# Patient Record
Sex: Female | Born: 1960 | Race: White | Hispanic: No | Smoking: Never smoker
Health system: Southern US, Community
[De-identification: ages and names within clinical notes are randomized; demographics above are authoritative.]

## PROBLEM LIST (undated history)

## (undated) DIAGNOSIS — E785 Hyperlipidemia, unspecified: Secondary | ICD-10-CM

## (undated) DIAGNOSIS — F32A Depression, unspecified: Secondary | ICD-10-CM

## (undated) DIAGNOSIS — I10 Essential (primary) hypertension: Secondary | ICD-10-CM

## (undated) DIAGNOSIS — F329 Major depressive disorder, single episode, unspecified: Secondary | ICD-10-CM

## (undated) DIAGNOSIS — E119 Type 2 diabetes mellitus without complications: Secondary | ICD-10-CM

## (undated) HISTORY — PX: ESOPHAGOGASTRODUODENOSCOPY: SHX1529

## (undated) HISTORY — PX: COLONOSCOPY: SHX174

## (undated) HISTORY — PX: ABDOMINAL HYSTERECTOMY: SHX81

---

## 2004-07-12 ENCOUNTER — Ambulatory Visit: Payer: Self-pay | Admitting: Family Medicine

## 2004-07-14 ENCOUNTER — Ambulatory Visit: Payer: Self-pay | Admitting: Family Medicine

## 2004-08-18 ENCOUNTER — Ambulatory Visit: Payer: Self-pay | Admitting: Family Medicine

## 2004-09-08 ENCOUNTER — Ambulatory Visit: Payer: Self-pay | Admitting: Family Medicine

## 2004-09-11 ENCOUNTER — Ambulatory Visit: Payer: Self-pay | Admitting: Family Medicine

## 2004-10-11 ENCOUNTER — Ambulatory Visit: Payer: Self-pay | Admitting: Family Medicine

## 2006-07-15 ENCOUNTER — Emergency Department: Payer: Self-pay | Admitting: Unknown Physician Specialty

## 2006-08-25 ENCOUNTER — Ambulatory Visit: Payer: Self-pay | Admitting: Gastroenterology

## 2006-09-06 ENCOUNTER — Ambulatory Visit: Payer: Self-pay | Admitting: Gastroenterology

## 2006-10-18 ENCOUNTER — Ambulatory Visit: Payer: Self-pay | Admitting: Family Medicine

## 2009-09-09 ENCOUNTER — Ambulatory Visit: Payer: Self-pay | Admitting: Family Medicine

## 2010-10-06 ENCOUNTER — Ambulatory Visit: Payer: Self-pay | Admitting: Family Medicine

## 2013-02-27 ENCOUNTER — Ambulatory Visit: Payer: Self-pay | Admitting: Family Medicine

## 2014-10-27 DIAGNOSIS — R519 Headache, unspecified: Secondary | ICD-10-CM | POA: Insufficient documentation

## 2014-10-27 DIAGNOSIS — H8110 Benign paroxysmal vertigo, unspecified ear: Secondary | ICD-10-CM | POA: Insufficient documentation

## 2014-11-12 DIAGNOSIS — F32A Depression, unspecified: Secondary | ICD-10-CM | POA: Insufficient documentation

## 2014-11-13 ENCOUNTER — Other Ambulatory Visit: Payer: Self-pay | Admitting: Family Medicine

## 2014-11-13 DIAGNOSIS — Z1231 Encounter for screening mammogram for malignant neoplasm of breast: Secondary | ICD-10-CM

## 2014-11-20 ENCOUNTER — Ambulatory Visit
Admission: RE | Admit: 2014-11-20 | Discharge: 2014-11-20 | Disposition: A | Discharge status: Home / Self Care | Payer: Medicaid Other | Source: Ambulatory Visit | Attending: Family Medicine | Admitting: Family Medicine

## 2014-11-20 DIAGNOSIS — Z1231 Encounter for screening mammogram for malignant neoplasm of breast: Secondary | ICD-10-CM | POA: Diagnosis present

## 2014-11-21 ENCOUNTER — Other Ambulatory Visit: Payer: Self-pay | Admitting: Family Medicine

## 2014-11-21 DIAGNOSIS — R928 Other abnormal and inconclusive findings on diagnostic imaging of breast: Secondary | ICD-10-CM

## 2014-11-26 ENCOUNTER — Ambulatory Visit
Admission: RE | Admit: 2014-11-26 | Discharge: 2014-11-26 | Disposition: A | Discharge status: Home / Self Care | Payer: Medicaid Other | Source: Ambulatory Visit | Attending: Family Medicine | Admitting: Family Medicine

## 2014-11-26 DIAGNOSIS — R928 Other abnormal and inconclusive findings on diagnostic imaging of breast: Secondary | ICD-10-CM | POA: Diagnosis present

## 2015-11-18 ENCOUNTER — Other Ambulatory Visit: Payer: Self-pay | Admitting: Orthopedic Surgery

## 2015-11-18 DIAGNOSIS — M4722 Other spondylosis with radiculopathy, cervical region: Secondary | ICD-10-CM

## 2015-11-18 DIAGNOSIS — R29898 Other symptoms and signs involving the musculoskeletal system: Secondary | ICD-10-CM

## 2015-12-10 ENCOUNTER — Ambulatory Visit
Admission: RE | Admit: 2015-12-10 | Discharge: 2015-12-10 | Disposition: A | Discharge status: Home / Self Care | Payer: Medicaid Other | Source: Ambulatory Visit | Attending: Orthopedic Surgery | Admitting: Orthopedic Surgery

## 2015-12-10 DIAGNOSIS — R29898 Other symptoms and signs involving the musculoskeletal system: Secondary | ICD-10-CM | POA: Diagnosis present

## 2015-12-10 DIAGNOSIS — M4722 Other spondylosis with radiculopathy, cervical region: Secondary | ICD-10-CM | POA: Diagnosis not present

## 2015-12-10 DIAGNOSIS — M4802 Spinal stenosis, cervical region: Secondary | ICD-10-CM | POA: Diagnosis not present

## 2016-12-21 DIAGNOSIS — E221 Hyperprolactinemia: Secondary | ICD-10-CM | POA: Insufficient documentation

## 2018-01-15 ENCOUNTER — Encounter: Payer: Self-pay | Admitting: *Deleted

## 2018-01-16 ENCOUNTER — Encounter: Payer: Self-pay | Admitting: *Deleted

## 2018-01-16 ENCOUNTER — Ambulatory Visit
Admission: RE | Admit: 2018-01-16 | Discharge: 2018-01-16 | Disposition: A | Discharge status: Home / Self Care | Payer: Medicare Other | Source: Ambulatory Visit | Attending: Gastroenterology | Admitting: Gastroenterology

## 2018-01-16 ENCOUNTER — Encounter
Admission: RE | Disposition: A | Discharge status: Home / Self Care | Payer: Self-pay | Source: Ambulatory Visit | Attending: Gastroenterology

## 2018-01-16 ENCOUNTER — Ambulatory Visit: Payer: Medicare Other | Admitting: Certified Registered"

## 2018-01-16 DIAGNOSIS — I10 Essential (primary) hypertension: Secondary | ICD-10-CM | POA: Insufficient documentation

## 2018-01-16 DIAGNOSIS — Z1211 Encounter for screening for malignant neoplasm of colon: Secondary | ICD-10-CM | POA: Insufficient documentation

## 2018-01-16 DIAGNOSIS — E119 Type 2 diabetes mellitus without complications: Secondary | ICD-10-CM | POA: Diagnosis not present

## 2018-01-16 DIAGNOSIS — E785 Hyperlipidemia, unspecified: Secondary | ICD-10-CM | POA: Diagnosis not present

## 2018-01-16 DIAGNOSIS — D120 Benign neoplasm of cecum: Secondary | ICD-10-CM | POA: Insufficient documentation

## 2018-01-16 DIAGNOSIS — Z881 Allergy status to other antibiotic agents status: Secondary | ICD-10-CM | POA: Diagnosis not present

## 2018-01-16 DIAGNOSIS — Z882 Allergy status to sulfonamides status: Secondary | ICD-10-CM | POA: Insufficient documentation

## 2018-01-16 DIAGNOSIS — F329 Major depressive disorder, single episode, unspecified: Secondary | ICD-10-CM | POA: Diagnosis not present

## 2018-01-16 DIAGNOSIS — Z79899 Other long term (current) drug therapy: Secondary | ICD-10-CM | POA: Insufficient documentation

## 2018-01-16 HISTORY — PX: COLONOSCOPY WITH PROPOFOL: SHX5780

## 2018-01-16 HISTORY — DX: Type 2 diabetes mellitus without complications: E11.9

## 2018-01-16 HISTORY — DX: Hyperlipidemia, unspecified: E78.5

## 2018-01-16 HISTORY — DX: Essential (primary) hypertension: I10

## 2018-01-16 HISTORY — DX: Major depressive disorder, single episode, unspecified: F32.9

## 2018-01-16 HISTORY — DX: Depression, unspecified: F32.A

## 2018-01-16 LAB — GLUCOSE, CAPILLARY: GLUCOSE-CAPILLARY: 94 mg/dL (ref 70–99)

## 2018-01-16 SURGERY — COLONOSCOPY WITH PROPOFOL
Anesthesia: General

## 2018-01-16 MED ORDER — FENTANYL CITRATE (PF) 100 MCG/2ML IJ SOLN
INTRAMUSCULAR | Status: DC | PRN
  Administered 2018-01-16: 50 ug via INTRAVENOUS

## 2018-01-16 MED ORDER — PROPOFOL 500 MG/50ML IV EMUL
INTRAVENOUS | Status: AC
  Filled 2018-01-16: qty 50

## 2018-01-16 MED ORDER — FENTANYL CITRATE (PF) 100 MCG/2ML IJ SOLN
INTRAMUSCULAR | Status: AC
  Filled 2018-01-16: qty 2

## 2018-01-16 MED ORDER — PROPOFOL 500 MG/50ML IV EMUL
INTRAVENOUS | Status: DC | PRN
  Administered 2018-01-16: 100 ug/kg/min via INTRAVENOUS

## 2018-01-16 MED ORDER — MIDAZOLAM HCL 2 MG/2ML IJ SOLN
INTRAMUSCULAR | Status: DC | PRN
  Administered 2018-01-16: 2 mg via INTRAVENOUS

## 2018-01-16 MED ORDER — MIDAZOLAM HCL 2 MG/2ML IJ SOLN
INTRAMUSCULAR | Status: AC
  Filled 2018-01-16: qty 2

## 2018-01-16 MED ORDER — SODIUM CHLORIDE 0.9 % IV SOLN
INTRAVENOUS | Status: DC
  Administered 2018-01-16: 1000 mL via INTRAVENOUS

## 2018-01-16 NOTE — Anesthesia Procedure Notes (Signed)
Performed by: Cook-Martin, Nancy Arvin Pre-anesthesia Checklist: Patient identified, Emergency Drugs available, Suction available, Patient being monitored and Timeout performed Patient Re-evaluated:Patient Re-evaluated prior to induction Oxygen Delivery Method: Nasal cannula Preoxygenation: Pre-oxygenation with 100% oxygen Induction Type: IV induction Placement Confirmation: positive ETCO2 and CO2 detector       

## 2018-01-16 NOTE — Anesthesia Post-op Follow-up Note (Signed)
Anesthesia QCDR form completed.        

## 2018-01-16 NOTE — Transfer of Care (Signed)
Immediate Anesthesia Transfer of Care Note  Patient: Monica Curtis  Procedure(s) Performed: COLONOSCOPY WITH PROPOFOL (N/A )  Patient Location: PACU  Anesthesia Type:General  Level of Consciousness: awake and sedated  Airway & Oxygen Therapy: Patient Spontanous Breathing and Patient connected to nasal cannula oxygen  Post-op Assessment: Report given to RN and Post -op Vital signs reviewed and stable  Post vital signs: Reviewed and stable  Last Vitals:  Vitals Value Taken Time  BP    Temp    Pulse    Resp    SpO2      Last Pain:  Vitals:   01/16/18 0708  TempSrc: Tympanic  PainSc: 0-No pain         Complications: No apparent anesthesia complications

## 2018-01-16 NOTE — Op Note (Addendum)
[facility] Gastroenterology Patient Name: Monica Curtis Procedure Date: 01/16/2018 7:37 AM MRN: [id number] Account #: [id number] Date of Birth: Oct 16, 1960 Admit Type: Outpatient Age: 57 Room: [facility] ENDO ROOM 3 Gender: Female Note Status: Finalized Procedure:            Colonoscopy Indications:          Screening for colorectal malignant neoplasm Providers:            Lollie Sails, MD Referring MD:         Youlanda Roys. Lovie Macadamia, MD (Referring MD) Medicines:            Monitored Anesthesia Care Complications:        No immediate complications. Procedure:            Pre-Anesthesia Assessment:                       - ASA Grade Assessment: III - A patient with severe                        systemic disease.                       After obtaining informed consent, the colonoscope was                        passed under direct vision. Throughout the procedure,                        the patient's blood pressure, pulse, and oxygen                        saturations were monitored continuously. The                        Colonoscope was introduced through the anus and                        advanced to the the cecum, identified by appendiceal                        orifice and ileocecal valve. The colonoscopy was                        performed without difficulty. The patient tolerated the                        procedure well. The quality of the bowel preparation                        was fair. Findings:      A 11 mm polyp was found in the cecum. The polyp was sessile. The polyp       was removed with a piecemeal technique using a cold biopsy forceps. The       polyp was removed with a cold snare. The polyp was removed with a lift       and cut technique using a cold snare. Resection and retrieval were       complete.      The exam was otherwise without abnormality.      The retroflexed view of the distal rectum and anal verge was normal  and       showed no anal or  rectal abnormalities.      The digital rectal exam was normal. Impression:           - Preparation of the colon was fair.                       - One 11 mm polyp in the cecum, removed piecemeal using                        a cold biopsy forceps, removed with a cold snare and                        removed using lift and cut and a cold snare. Resected                        and retrieved.                       - The examination was otherwise normal.                       - The distal rectum and anal verge are normal on                        retroflexion view. Recommendation:       - Discharge patient to home.                       - Await pathology results.                       - Telephone GI clinic for pathology results in 1 week. Procedure Code(s):    --- Professional ---                       [phone], Colonoscopy, flexible; with removal of tumor(s),                        polyp(s), or other lesion(s) by snare technique Diagnosis Code(s):    --- Professional ---                       Z12.11, Encounter for screening for malignant neoplasm                        of colon                       D12.0, Benign neoplasm of cecum CPT copyright 2017 American Medical Association. All rights reserved. The codes documented in this report are preliminary and upon coder review may  be revised to meet current compliance requirements. Lollie Sails, MD 01/16/2018 8:13:10 AM This report has been signed electronically. Number of Addenda: 0 Note Initiated On: 01/16/2018 7:37 AM Scope Withdrawal Time: 0 hours 15 minutes 25 seconds  Total Procedure Duration: 0 hours 25 minutes 29 seconds       [facility]

## 2018-01-16 NOTE — H&P (Signed)
Outpatient short stay form Pre-procedure 01/16/2018 7:24 AM Monica Sails MD  Primary Physician: Dr. Juluis Pitch  Reason for visit: Colonoscopy  History of present illness: Patient is a 57 year old female presenting today as above.  Last colonoscopy was a number of years ago.  She does have problems with chronic constipation although this is somewhat ameliorated by use of MiraLAX.  We discussed using MiraLAX as a regular medication daily.  He tolerated her prep well.  She takes no aspirin or blood thinning agent.    Current Facility-Administered Medications:  .  0.9 %  sodium chloride infusion, , Intravenous, Continuous, Monica Sails, MD  Medications Prior to Admission  Medication Sig Dispense Refill Last Dose  . albuterol (PROVENTIL HFA;VENTOLIN HFA) 108 (90 Base) MCG/ACT inhaler Inhale 2 puffs into the lungs every 6 (six) hours as needed for wheezing or shortness of breath.   Past Month at Unknown time  . cabergoline (DOSTINEX) 0.5 MG tablet Take 0.5 mg by mouth 3 (three) times a week.   Past Week at Unknown time  . cetirizine (ZYRTEC) 10 MG tablet Take 10 mg by mouth daily.   Past Month at Unknown time  . clonazePAM (KLONOPIN) 1 MG tablet Take 1 mg by mouth at bedtime.   01/15/2018 at Unknown time  . hydrocortisone (ANUSOL-HC) 2.5 % rectal cream Place 1 application rectally 3 (three) times daily.   01/15/2018 at Unknown time  . lamoTRIgine (LAMICTAL) 200 MG tablet Take 200 mg by mouth 2 (two) times daily.   01/15/2018 at Unknown time  . meclizine (ANTIVERT) 25 MG tablet Take 25 mg by mouth 3 (three) times daily as needed for dizziness.   Past Month at Unknown time  . paliperidone (INVEGA SUSTENNA) 156 MG/ML SUSY injection Inject 156 mg into the muscle every 28 (twenty-eight) days.   Past Month at Unknown time  . sertraline (ZOLOFT) 100 MG tablet Take 100 mg by mouth 2 (two) times daily.   01/15/2018 at Unknown time  . simvastatin (ZOCOR) 20 MG tablet Take 20 mg by mouth daily at 6  PM.   01/15/2018 at Unknown time     Allergies  Allergen Reactions  . Sulfa Antibiotics Hives     Past Medical History:  Diagnosis Date  . Depression   . Diabetes mellitus without complication ([place])   . Hyperlipidemia   . Hypertension     Review of systems:      Physical Exam    Heart and lungs: Regular rate and rhythm without rub or gallop, lungs are bilaterally clear.    HEENT: Normocephalic atraumatic eyes are anicteric    Other:    Pertinant exam for procedure: Soft nontender nondistended bowel sounds positive normoactive.  There is mild discomfort in the suprapubic to right lower quadrant region.  There are no masses or rebound.    Planned proceedures: Colonoscopy and indicated procedures. I have discussed the risks benefits and complications of procedures to include not limited to bleeding, infection, perforation and the risk of sedation and the patient wishes to proceed.     Monica Sails, MD Gastroenterology 01/16/2018  7:24 AM

## 2018-01-16 NOTE — Anesthesia Preprocedure Evaluation (Signed)
Anesthesia Evaluation  Patient identified by MRN, date of birth, ID band Patient awake    Reviewed: Allergy & Precautions, H&P , NPO status , Patient's Chart, lab work & pertinent test results, reviewed documented beta blocker date and time   History of Anesthesia Complications Negative for: history of anesthetic complications  Airway Mallampati: III  TM Distance: >3 FB Neck ROM: full    Dental  (+) Implants, Dental Advidsory Given, Caps, Chipped   Pulmonary neg pulmonary ROS,           Cardiovascular Exercise Tolerance: Good hypertension, (-) angina(-) CAD, (-) Past MI, (-) Cardiac Stents and (-) CABG (-) dysrhythmias (-) Valvular Problems/Murmurs     Neuro/Psych PSYCHIATRIC DISORDERS Depression negative neurological ROS     GI/Hepatic negative GI ROS, Neg liver ROS,   Endo/Other  diabetes, Well Controlled  Renal/GU negative Renal ROS  negative genitourinary   Musculoskeletal   Abdominal   Peds  Hematology negative hematology ROS (+)   Anesthesia Other Findings Past Medical History: No date: Depression No date: Diabetes mellitus without complication (HCC) No date: Hyperlipidemia No date: Hypertension   Reproductive/Obstetrics negative OB ROS                             Anesthesia Physical Anesthesia Plan  ASA: II  Anesthesia Plan: General   Post-op Pain Management:    Induction: Intravenous  PONV Risk Score and Plan: 3 and Propofol infusion and TIVA  Airway Management Planned: Nasal Cannula  Additional Equipment:   Intra-op Plan:   Post-operative Plan:   Informed Consent: I have reviewed the patients History and Physical, chart, labs and discussed the procedure including the risks, benefits and alternatives for the proposed anesthesia with the patient or authorized representative who has indicated his/her understanding and acceptance.   Dental Advisory Given  Plan  Discussed with: Anesthesiologist, CRNA and Surgeon  Anesthesia Plan Comments:         Anesthesia Quick Evaluation

## 2018-01-16 NOTE — Anesthesia Postprocedure Evaluation (Signed)
Anesthesia Post Note  Patient: Monica Curtis  Procedure(s) Performed: COLONOSCOPY WITH PROPOFOL (N/A )  Patient location during evaluation: Endoscopy Anesthesia Type: General Level of consciousness: awake and alert Pain management: pain level controlled Vital Signs Assessment: post-procedure vital signs reviewed and stable Respiratory status: spontaneous breathing, nonlabored ventilation, respiratory function stable and patient connected to nasal cannula oxygen Cardiovascular status: blood pressure returned to baseline and stable Postop Assessment: no apparent nausea or vomiting Anesthetic complications: no     Last Vitals:  Vitals:   01/16/18 0832 01/16/18 0842  BP: (!) 158/83 (!) 167/72  Pulse:    Resp:    Temp:    SpO2:      Last Pain:  Vitals:   01/16/18 0842  TempSrc:   PainSc: 0-No pain                 Martha Clan

## 2018-01-17 ENCOUNTER — Encounter: Payer: Self-pay | Admitting: Gastroenterology

## 2018-01-17 LAB — SURGICAL PATHOLOGY

## 2018-05-08 ENCOUNTER — Encounter: Payer: Self-pay | Admitting: Occupational Therapy

## 2018-05-08 ENCOUNTER — Other Ambulatory Visit: Payer: Self-pay

## 2018-05-08 ENCOUNTER — Ambulatory Visit: Payer: Medicare Other | Attending: Student | Admitting: Occupational Therapy

## 2018-05-08 DIAGNOSIS — R600 Localized edema: Secondary | ICD-10-CM | POA: Insufficient documentation

## 2018-05-08 DIAGNOSIS — M25641 Stiffness of right hand, not elsewhere classified: Secondary | ICD-10-CM | POA: Insufficient documentation

## 2018-05-08 NOTE — Patient Instructions (Signed)
Heat  PROM for extention of PIP on table with palm -and behind PIP joint  AROM for PIP extention into table   Tendon glides   PROM for MC flexion  Intrinsic fist AROM  And composite flexion to palm  10 reps  each   2 x day  Keep pain under 8-4/53  Wear silicon sleeve for 5th for edema - wear at night time

## 2018-05-08 NOTE — Therapy (Signed)
[facility] [address], [place], [address] Phone: [phone]   Fax:  [phone]  Occupational Therapy Evaluation  Patient Details  Name: Monica Curtis MRN: [id number] Date of Birth: 1960-08-21 Referring Provider (OT): Mcghee   Encounter Date: 05/08/2018  OT End of Session - 05/08/18 1143    Visit Number  1    Number of Visits  2    Date for OT Re-Evaluation  06/05/18    OT Start Time  1016    OT Stop Time  1108    OT Time Calculation (min)  52 min    Activity Tolerance  Patient tolerated treatment well    Behavior During Therapy  [facility] for tasks assessed/performed       Past Medical History:  Diagnosis Date  . Depression   . Diabetes mellitus without complication ([place])   . Hyperlipidemia   . Hypertension     Past Surgical History:  Procedure Laterality Date  . ABDOMINAL HYSTERECTOMY    . CESAREAN SECTION    . COLONOSCOPY    . COLONOSCOPY WITH PROPOFOL N/A 01/16/2018   Procedure: COLONOSCOPY WITH PROPOFOL;  Surgeon: Lollie Sails, MD;  Location: [facility] ENDOSCOPY;  Service: Endoscopy;  Laterality: N/A;  . ESOPHAGOGASTRODUODENOSCOPY      There were no vitals filed for this visit.  Subjective Assessment - 05/08/18 1136    Subjective   I did not know they send you to therapy for this injury - doing okay - pain when hitting it  and stretching it -  by finger still bigger - cannot touch my palm - but otherwise using it - I am L handed     Patient Stated Goals  Want to touch my palm and swelling to go down    Currently in Pain?  Yes    Pain Score  1     Pain Location  Finger (Comment which one)    Pain Orientation  Right    Pain Descriptors / Indicators  Aching    Pain Type  Acute pain    Pain Onset  More than a month ago    Pain Frequency  Occasional        [facility] OT Assessment - 05/08/18 0001      Assessment   Medical Diagnosis  L 5th PIP dislocation    Referring Provider (OT)   Mcghee    Onset Date/Surgical Date  02/23/18    Hand Dominance  Left      Home  Environment   Lives With  Alone      Prior Function   Leisure  Work as Scientist, water quality at [employer], like to watch tv       Strength   Right Hand Grip (lbs)  50    Right Hand Lateral Pinch  20 lbs    Right Hand 3 Point Pinch  18 lbs    Left Hand Grip (lbs)  60    Left Hand Lateral Pinch  20 lbs    Left Hand 3 Point Pinch  15 lbs      Right Hand AROM   R Little  MCP 0-90  80 Degrees    R Little PIP 0-100  105 Degrees   -10   R Little DIP 0-70  30 Degrees      Left Hand AROM   L Little  MCP 0-90  90 Degrees    L Little PIP 0-100  100 Degrees  L Little DIP 0-70  55 Degrees        FLuidotherapy done with AROM for digits prior to review of HEP  Pt needed several repeats of education for HEP  Hand out provided  Pt showed increase AROM after review of HEP and fluido    Heat  PROM for extention of PIP on table with palm -and behind PIP joint  AROM for PIP extention into table   Tendon glides   PROM for MC flexion  Intrinsic fist AROM  And composite flexion to palm  10 reps  each   2 x day  Keep pain under 6-7/12  Wear silicon sleeve for 5th for edema - wear at night time              OT Education - 05/08/18 1142    Education Details  findings of eval and HEP review    Person(s) Educated  Patient    Methods  Explanation;Demonstration;Handout    Comprehension  Verbalized understanding;Returned demonstration          OT Long Term Goals - 05/08/18 1147      OT LONG TERM GOAL #1   Title  Pt to be ind in HEP to decrease edema, and  increase MC and DIP flexion and PIP extention to return to prior level of function     Baseline  need education of HEP     Time  2    Period  Weeks    Status  New    Target Date  05/22/18            Plan - 05/08/18 1144    Clinical Impression Statement  Pt present about 10 wks out from dislocation of 5th PIP of R 5th digit - pt is L hand  dominant and report using her hand in most all act - but still present with increase edema 0.7 cm , and MC flexion decrease by 10 degrees, PIP extention impaired by 10 degrees and DIP decrease by 20 degrees - limiting her composite fist - strength in grip and prehension  WNL - and pain only with PROM - pt to cont iwht HEP for 2 wks and return for reassessment     Occupational performance deficits (Please refer to evaluation for details):  Work    Environmental manager    Current Impairments/barriers affecting progress:  decrease memory per pt because of medication use     OT Frequency  Biweekly    OT Treatment/Interventions  Patient/family education;Therapeutic exercise;Moist Heat;Passive range of motion    Plan  pt to follow up in 2 wks to assess progress     Clinical Decision Making  Limited treatment options, no task modification necessary    OT Home Exercise Plan  see pt instruction        Patient will benefit from skilled therapeutic intervention in order to improve the following deficits and impairments:  Pain, Increased edema, Impaired flexibility, Decreased range of motion  Visit Diagnosis: Stiffness of right hand, not elsewhere classified - Plan: Ot plan of care cert/re-cert  Localized edema - Plan: Ot plan of care cert/re-cert    Problem List There are no active problems to display for this patient.   Rosalyn Gess OTR/L,CLT 05/08/2018, 11:50 AM  [place] [facility] [address], [place], [address] Phone: [phone]   Fax:  [phone]  Name: Monica Curtis MRN: [id number] Date of Birth: [birth year]

## 2018-07-23 ENCOUNTER — Other Ambulatory Visit: Payer: Self-pay | Admitting: Family Medicine

## 2018-07-23 DIAGNOSIS — Z1231 Encounter for screening mammogram for malignant neoplasm of breast: Secondary | ICD-10-CM

## 2019-07-26 ENCOUNTER — Other Ambulatory Visit: Payer: Self-pay | Admitting: Family Medicine

## 2019-07-26 DIAGNOSIS — Z1231 Encounter for screening mammogram for malignant neoplasm of breast: Secondary | ICD-10-CM

## 2019-08-21 ENCOUNTER — Ambulatory Visit
Admission: RE | Admit: 2019-08-21 | Discharge: 2019-08-21 | Disposition: A | Discharge status: Home / Self Care | Payer: Medicare Other | Source: Ambulatory Visit | Attending: Family Medicine | Admitting: Family Medicine

## 2019-08-21 DIAGNOSIS — Z1231 Encounter for screening mammogram for malignant neoplasm of breast: Secondary | ICD-10-CM | POA: Insufficient documentation

## 2019-08-22 ENCOUNTER — Ambulatory Visit: Payer: Medicare Other | Attending: Internal Medicine

## 2019-08-22 DIAGNOSIS — Z23 Encounter for immunization: Secondary | ICD-10-CM

## 2019-08-22 NOTE — Progress Notes (Signed)
   Covid-19 Vaccination Clinic  Name:  Monica Curtis    MRN: [id number] DOB: 1960-06-20  08/22/2019  Ms. Monica Curtis was observed post Covid-19 immunization for 15 minutes without incident. She was provided with Vaccine Information Sheet and instruction to access the V-Safe system.   Monica Curtis was instructed to call 911 with any severe reactions post vaccine: Marland Kitchen Difficulty breathing  . Swelling of face and throat  . A fast heartbeat  . A bad rash all over body  . Dizziness and weakness   Immunizations Administered    Name Date Dose VIS Date Route   Pfizer COVID-19 Vaccine 08/22/2019  8:09 AM 0.3 mL 05/24/2019 Intramuscular   Manufacturer: [place]   Lot: [id number]   [place]: [id number]

## 2019-08-24 ENCOUNTER — Ambulatory Visit: Payer: Medicare Other

## 2019-09-02 ENCOUNTER — Ambulatory Visit: Payer: Medicare Other

## 2019-09-17 ENCOUNTER — Ambulatory Visit: Payer: Medicare Other | Attending: Internal Medicine

## 2019-09-17 DIAGNOSIS — Z23 Encounter for immunization: Secondary | ICD-10-CM

## 2019-09-17 NOTE — Progress Notes (Signed)
   Covid-19 Vaccination Clinic  Name:  TIMIYA GERGEL    MRN: [id number] DOB: [birth year]  09/17/2019  Ms. Degraw was observed post Covid-19 immunization for 15 minutes without incident. She was provided with Vaccine Information Sheet and instruction to access the V-Safe system.   Ms. Hamar was instructed to call 911 with any severe reactions post vaccine: Marland Kitchen Difficulty breathing  . Swelling of face and throat  . A fast heartbeat  . A bad rash all over body  . Dizziness and weakness   Immunizations Administered    Name Date Dose VIS Date Route   Pfizer COVID-19 Vaccine 09/17/2019 11:41 AM 0.3 mL 05/24/2019 Intramuscular   Manufacturer: [place]   Lot: [id number]   [place]: [id number]

## 2019-12-19 ENCOUNTER — Other Ambulatory Visit: Payer: Self-pay

## 2019-12-19 DIAGNOSIS — E785 Hyperlipidemia, unspecified: Secondary | ICD-10-CM | POA: Insufficient documentation

## 2019-12-19 DIAGNOSIS — E119 Type 2 diabetes mellitus without complications: Secondary | ICD-10-CM | POA: Insufficient documentation

## 2019-12-19 DIAGNOSIS — I10 Essential (primary) hypertension: Secondary | ICD-10-CM | POA: Insufficient documentation

## 2020-04-01 ENCOUNTER — Other Ambulatory Visit: Payer: Self-pay | Admitting: Unknown Physician Specialty

## 2020-04-01 DIAGNOSIS — R42 Dizziness and giddiness: Secondary | ICD-10-CM

## 2020-04-20 ENCOUNTER — Ambulatory Visit
Admission: RE | Admit: 2020-04-20 | Discharge: 2020-04-20 | Disposition: A | Discharge status: Home / Self Care | Payer: Medicare Other | Source: Ambulatory Visit | Attending: Unknown Physician Specialty | Admitting: Unknown Physician Specialty

## 2020-04-20 ENCOUNTER — Other Ambulatory Visit: Payer: Self-pay

## 2020-04-20 DIAGNOSIS — R42 Dizziness and giddiness: Secondary | ICD-10-CM | POA: Diagnosis present

## 2020-04-20 MED ORDER — GADOBUTROL 1 MMOL/ML IV SOLN
7.0000 mL | Freq: Once | INTRAVENOUS | Status: AC | PRN
  Administered 2020-04-20: 7 mL via INTRAVENOUS

## 2020-05-10 DIAGNOSIS — G2119 Other drug induced secondary parkinsonism: Secondary | ICD-10-CM | POA: Insufficient documentation

## 2020-05-10 DIAGNOSIS — F259 Schizoaffective disorder, unspecified: Secondary | ICD-10-CM | POA: Insufficient documentation

## 2020-05-26 ENCOUNTER — Other Ambulatory Visit: Payer: Self-pay

## 2020-05-26 ENCOUNTER — Ambulatory Visit: Payer: Medicare Other | Attending: Otolaryngology | Admitting: Physical Therapy

## 2020-05-26 ENCOUNTER — Encounter: Payer: Self-pay | Admitting: Physical Therapy

## 2020-05-26 DIAGNOSIS — R42 Dizziness and giddiness: Secondary | ICD-10-CM | POA: Insufficient documentation

## 2020-05-26 DIAGNOSIS — R2681 Unsteadiness on feet: Secondary | ICD-10-CM | POA: Insufficient documentation

## 2020-05-26 NOTE — Therapy (Signed)
[place] MAIN [facility] SERVICES [address] [place], [place], [address] Phone: [phone]   Fax:  [phone]  Physical Therapy Evaluation  Patient Details  Name: Monica Curtis MRN: [id number] Date of Birth: 1961/03/19 No data recorded  Encounter Date: 05/26/2020   PT End of Session - 05/26/20 1529    Visit Number 1    Number of Visits 9    Date for PT Re-Evaluation 07/21/20    PT Start Time 1411    PT Stop Time 1512    PT Time Calculation (min) 61 min    Activity Tolerance Patient tolerated treatment well    Behavior During Therapy [facility] for tasks assessed/performed           Past Medical History:  Diagnosis Date  . Depression   . Diabetes mellitus without complication ([place])   . Hyperlipidemia   . Hypertension     Past Surgical History:  Procedure Laterality Date  . ABDOMINAL HYSTERECTOMY    . CESAREAN SECTION    . COLONOSCOPY    . COLONOSCOPY WITH PROPOFOL N/A 01/16/2018   Procedure: COLONOSCOPY WITH PROPOFOL;  Surgeon: Lollie Sails, MD;  Location: [facility] ENDOSCOPY;  Service: Endoscopy;  Laterality: N/A;  . ESOPHAGOGASTRODUODENOSCOPY      There were no vitals filed for this visit.    Subjective Assessment - 05/26/20 1606    Subjective Patient reports that her symptoms of vertigo and dizziness were worse in October and that her symptoms are better now but states she is still getting dizziness at times with bending over, rolling over in bed, head turns and getting into or out of bed.    Pertinent History History obtained from medical record and patient report.  Patient reports her most recent episodes of vertigo occurred in October and she was also getting dizziness and lightheadedness. Patient presented to the ENT physician's office on 03/26/2020 secondary to dizziness and was found to have positive left BPPV and was treated with Epley maneuver per medical record.  Patient was seen again for follow-up treatment on  04/01/2020 at which time she was also found to have positive right BPPV with some central features per medical record and a brain MRI was ordered.  Brain MRI revealed normal appearance of the brain itself and no abnormality was seen per medical record.  Patient reports that during the ENT visit when performing the Epley maneuver that they found something that indicated to send her to Dr. Brigitte Pulse, neurologist.  Patient reports she had a sinus problem and was put on an antibiotic and that cleared this issue.  Patient reports in October she had 1 fall while bending forward at work and reports most recently she had a fall 2 weeks ago when she slipped going up steps at her son's home.  Patient states that in October her dizziness was bad, but states now her dizziness is very mild and it comes and goes when she is at work and at home.  Patient states her dizziness comes on sometimes at work when she is looking up and down or bending down.  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  Patient reports she is also getting mild dizziness intermittently when getting in or out of bed, turning her head, rolling over.  Patient states when she moves her neck she sometimes gets a lightheaded sensation.  Patient reports whenever she gets water in her ears she feels her symptoms "kind of acts up".  Patient reports that she saw  Dr. Brigitte Pulse and that he had reviewed her brain MRI and he stated her dizziness was caused in part due to medication per patient report.  Patient reports no changes in her medications.  Patient reports that she touches furniture at times due to dizziness.  Patient reports the past few days her dizziness has gotten worse.  Patient reports her dizziness lasts seconds.  Patient describes her dizziness as unsteadiness and lightheadedness and states she had a vertigo initially but that has resolved.  Patient's symptoms of dizziness are brought on by motion, positional, intermittent.  Patient reports that she has had episodes of  vertigo in the past that have gotten better with time.  Patient reports a family history of vertigo as well.    Patient Stated Goals To have decreased dizziness                             VESTIBULAR AND BALANCE EVALUATION   HISTORY:  Subjective history of current problem:  History obtained from medical record and patient report.  Patient reports her most recent episodes of vertigo occurred in October and she was also getting dizziness and lightheadedness. Patient presented to the ENT physician's office on 03/26/2020 secondary to dizziness and was found to have positive left BPPV and was treated with Epley maneuver per medical record.  Patient was seen again for follow-up treatment on 04/01/2020 at which time she was also found to have positive right BPPV with some central features per medical record and a brain MRI was ordered.  Brain MRI revealed normal appearance of the brain itself and no abnormality was seen per medical record.  Patient reports that during the ENT visit when performing the Epley maneuver that they found something that indicated to send her to Dr. Brigitte Pulse, neurologist.  Patient reports she had a sinus problem and was put on an antibiotic and that cleared this issue.  Patient reports in October she had 1 fall while bending forward at work and reports most recently she had a fall 2 weeks ago when she slipped going up steps at her son's home.  Patient states that in October her dizziness was bad, but states now her dizziness is very mild and it comes and goes when she is at work and at home.  Patient states her dizziness comes on sometimes at work when she is looking up and down or bending down.  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  Patient reports she is also getting mild dizziness intermittently when getting in or out of bed, turning her head, rolling over.  Patient states when she moves her neck she sometimes gets a lightheaded sensation.  Patient reports whenever  she gets water in her ears she feels her symptoms "kind of acts up".  Patient reports that she saw Dr. Brigitte Pulse and that he had reviewed her brain MRI and he stated her dizziness was caused in part due to medication per patient report.  Patient reports no changes in her medications.  Patient reports that she touches furniture at times due to dizziness.  Patient reports the past few days her dizziness has gotten worse.  Patient reports her dizziness lasts seconds.  Patient describes her dizziness as unsteadiness and lightheadedness and states she had a vertigo initially but that has resolved.  Patient's symptoms of dizziness are brought on by motion, positional, intermittent.  Patient reports that she has had episodes of vertigo in the past that have gotten  better with time.  Patient reports a family history of vertigo as well.  Description of dizziness: vertigo-resolved, unsteadiness, lightheadedness Frequency: past few days it has gotten worse Duration: seconds  (Initially lasted minutes) Symptom nature: motion provoked, positional, intermittent  Brain MRI: IMPRESSION: 1. Normal appearance of the brain itself. No abnormality seen to explain the presenting symptoms. 2. Some layering fluid in the left division of the sphenoid sinus and some opacified posterior ethmoid air cells on the left.   Progression of symptoms: better History of similar episodes: yes that got better on there own  Falls (yes/no): yes Number of falls in past 6 months: 1  Auditory complaints (tinnitus, pain, drainage): Vision (last eye exam, diplopia, recent changes): Patient wears glasses for reading     EXAMINATION  POSTURE: Rounded shoulders  COORDINATION: Finger to Nose: Deferred Past Pointing:  Deferred  MUSCULOSKELETAL SCREEN: Cervical Spine ROM: Active range of motion cervical spine within functional limits    Functional Mobility: Patient required min assist with bringing legs up onto mat table.  Gait:  Patient arrives ambulating without AD. Patient ambulates with decreased cadence with fair scanning of visual environment.  Patient reports she has difficulty with stairs and tries to avoid them.  Balance: Deferred  POSTURAL CONTROL TESTS:  Clinical Test of Sensory Interaction for Balance (CTSIB): To be assessed  OCULOMOTOR / VESTIBULAR TESTING:  Oculomotor Exam- Room Light  Normal Abnormal Comments  Ocular Alignment N    Ocular ROM N    Spontaneous Nystagmus N    Gaze evoked Nystagmus N    Smooth Pursuit  Abn Saccadic eye movements noted  Saccades N    VOR    deferred  VOR Cancellation N    Left Head Impulse    deferred  Right Head Impulse    deferred  Head Shaking Nystagmus    deferred    BPPV TESTS:  Symptoms Duration Intensity Nystagmus  Left Dix-Hallpike None  N/A None observed  Right Dix-Hallpike  vertigo  about 30 seconds  2/10 None observed in room light    FUNCTIONAL OUTCOME MEASURES:  Results Comments  DHI  62/100 Moderate perception of handicap; in need of intervention  ABC Scale  61.9% Falls risk; in need of intervention  DGI -  deferred  FOTO  59/100 Given the patient's risk adjustment variables, like-patients nationally had a FS score of 48/100 at intake    Canalith Repositioning Maneuver: On inverted mat table, performed right Dix-Hallpike testing and it was potentially positive as patient reported vertigo that lasted less than 30 seconds, but did not observe any nystagmus in room light. Performed right canalith repositioning maneuver (CRT). Repeated right Dix-Hallpike test and patient denied vertigo and no nystagmus is observed; however, performed 1 additional Epley maneuver to try to ensure clearance of particles.  Note:  This document was prepared using Dragon voice recognition software and may include unintentional dictation errors     PT Education - 05/26/20 1528    Education Details discussed BPPV and handout from [email or web address] provided; discussed  plan of care and goals; showed a video of + right BPPV from YouTube    Person(s) Educated Patient    Methods Explanation;Handout;Other (comment)    Comprehension Verbalized understanding            PT Short Term Goals - 05/26/20 1549      PT SHORT TERM GOAL #1   Title Patient will be independent with home exercise program in order to improve balance and decrease dizziness symptoms  in order to improve function at work and home and to have decreased falls risk.    Time 4    Period Weeks    Status New    Target Date 06/23/20             PT Long Term Goals - 05/26/20 1545      PT LONG TERM GOAL #1   Title Patient will have improved FOTO score of 6 points or greater in order to demonstrate improvements in patient's ADLs and functional performance.    Baseline Scored 59/100 on 05/26/2020;    Time 8    Period Weeks    Status New    Target Date 07/21/20      PT LONG TERM GOAL #2   Title Patient will reduce falls risk as indicated by activities specific balance confidence scale (ABC) score of 69% or greater.    Baseline Scored 61.9% on 05/26/2020;    Time 8    Period Weeks    Status New    Target Date 07/21/20      PT LONG TERM GOAL #3   Title Patient will reduce perceived disability to low levels as indicated by less than 40/100 on the dizziness handicap inventory.    Baseline Scored 62/100 on 05/26/2020;    Time 8    Period Weeks    Status New    Target Date 07/21/20      PT LONG TERM GOAL #4   Title Patient will report 50% or greater improvement in her symptoms of dizziness and imbalance overall with provoking motions and positions to be able to better perform activities at home and at work.    Baseline Patient reports difficulty with bending activities at work and with getting in and out of bed, rolling and head turns at home;    Time 8    Period Weeks    Status New    Target Date 07/21/20                  Plan - 05/26/20 1531    Clinical Impression  Statement Patient presents to clinic with reports of dizziness and imbalance with getting into or out of bed, rolling over in bed, bending over and with head turns.  Patient reports a family history of vertigo and reports that she has had multiple episodes of vertigo in the past that have resolved with time.  Patient with potentially positive right Dix-Hallpike to this date as this reproduce patient's symptoms of vertigo although no nystagmus was observed in room light.  Performed to right Epley maneuvers.  Patient scored 62/100 on the dizziness handicap inventory indicating moderate perception of handicap.  Patient scored 61.9% on the ABC scale indicating falls risk.  Patient scored 59/100 on an FOTO indicating some difficulty with functional activities.  Patient noted to have saccadic movements with smooth pursuits which is a potential central indicator.  Deferred head impulse testing and VOR secondary to positive right Dix-Hallpike test this date.  Patient would benefit from skilled vestibular PT services in order to address functional deficits and goals as stated on plan of care.  We will plan on repeating Dix-Hallpike test next session and performing Epley maneuver if indicated.  Once cleared, plan on further assessing balance.    Personal Factors and Comorbidities Comorbidity 3+;Past/Current Experience    Comorbidities depression, anxiety, HTN    Examination-Activity Limitations Bathing;Bed Mobility;Bend;Stairs    Examination-Participation Restrictions Cleaning;Occupation    Stability/Clinical Decision Making Evolving/Moderate complexity  Clinical Decision Making Moderate    Rehab Potential Good    PT Frequency 1x / week    PT Duration 8 weeks    PT Treatment/Interventions Canalith Repostioning;Balance training;Neuromuscular re-education;Therapeutic activities;Therapeutic exercise;Stair training;Gait training;Patient/family education;Vestibular    PT Next Visit Plan repeat Dix-Hallpike test and  perform Epley if indicated, once cleared, perform DGI and modified CTSIB    Consulted and Agree with Plan of Care Patient           Patient will benefit from skilled therapeutic intervention in order to improve the following deficits and impairments:  Dizziness,Decreased balance,Decreased mobility,Difficulty walking  Visit Diagnosis: Dizziness and giddiness  Unsteadiness on feet     Problem List Patient Active Problem List   Diagnosis Date Noted  . Diabetes mellitus type 2, uncomplicated ([place]) 42/87/6811  . Hyperlipidemia 12/19/2019  . Hypertension 12/19/2019  . Hyperprolactinemia ([place]) 12/21/2016  . Depression 11/12/2014  . Benign positional vertigo, unspecified laterality 10/27/2014  . Headache, unspecified headache type 10/27/2014   Lady Deutscher PT, DPT [phone] Lady Deutscher 05/26/2020, 4:12 PM  [place] MAIN [facility] [address] [place], [place], [address] Phone: [phone]   Fax:  [phone]  Name: Monica Curtis MRN: [id number] Date of Birth: Dec 29, 1960

## 2020-05-28 ENCOUNTER — Ambulatory Visit: Payer: Medicare Other | Admitting: Physical Therapy

## 2020-06-02 ENCOUNTER — Other Ambulatory Visit: Payer: Self-pay | Admitting: Dermatology

## 2020-06-04 ENCOUNTER — Other Ambulatory Visit: Payer: Self-pay | Admitting: Dermatology

## 2020-06-04 ENCOUNTER — Other Ambulatory Visit: Payer: Self-pay

## 2020-06-04 ENCOUNTER — Ambulatory Visit: Payer: Medicare Other | Admitting: Physical Therapy

## 2020-06-04 ENCOUNTER — Encounter: Payer: Self-pay | Admitting: Physical Therapy

## 2020-06-04 DIAGNOSIS — R42 Dizziness and giddiness: Secondary | ICD-10-CM

## 2020-06-04 DIAGNOSIS — R2681 Unsteadiness on feet: Secondary | ICD-10-CM

## 2020-06-04 NOTE — Therapy (Signed)
[facility] MAIN [facility] SERVICES [address] [place], [place], [address] Phone: [phone]   Fax:  [phone]  Physical Therapy Treatment  Patient Details  Name: SHAMANI CACCIA MRN: [id number] Date of Birth: 05/03/1961 No data recorded  Encounter Date: 06/04/2020   PT End of Session - 06/04/20 1250    Visit Number 2    Number of Visits 9    Date for PT Re-Evaluation 07/21/20    Authorization Type Evaluation date: 05/26/2020    PT Start Time 1250    PT Stop Time 1324    PT Time Calculation (min) 34 min    Activity Tolerance Patient tolerated treatment well    Behavior During Therapy [facility] for tasks assessed/performed           Past Medical History:  Diagnosis Date  . Depression   . Diabetes mellitus without complication ([facility])   . Hyperlipidemia   . Hypertension     Past Surgical History:  Procedure Laterality Date  . ABDOMINAL HYSTERECTOMY    . CESAREAN SECTION    . COLONOSCOPY    . COLONOSCOPY WITH PROPOFOL N/A 01/16/2018   Procedure: COLONOSCOPY WITH PROPOFOL;  Surgeon: Christena Deem, MD;  Location: [facility] ENDOSCOPY;  Service: Endoscopy;  Laterality: N/A;  . ESOPHAGOGASTRODUODENOSCOPY      There were no vitals filed for this visit.   Subjective Assessment - 06/04/20 1251    Subjective Patient reports she has not had any spinning/vertigo, but states still getting lightheadedness and needing to touch furniture for support at times. Patient reports she has not had any falls since last visit.    Pertinent History History obtained from medical record and patient report.  Patient reports her most recent episodes of vertigo occurred in October and she was also getting dizziness and lightheadedness. Patient presented to the ENT physician's office on 03/26/2020 secondary to dizziness and was found to have positive left BPPV and was treated with Epley maneuver per medical record.  Patient was seen again for follow-up treatment on  04/01/2020 at which time she was also found to have positive right BPPV with some central features per medical record and a brain MRI was ordered.  Brain MRI revealed normal appearance of the brain itself and no abnormality was seen per medical record.  Patient reports that during the ENT visit when performing the Epley maneuver that they found something that indicated to send her to Dr. Clelia Croft, neurologist.  Patient reports she had a sinus problem and was put on an antibiotic and that cleared this issue.  Patient reports in October she had 1 fall while bending forward at work and reports most recently she had a fall 2 weeks ago when she slipped going up steps at her son's home.  Patient states that in October her dizziness was bad, but states now her dizziness is very mild and it comes and goes when she is at work and at home.  Patient states her dizziness comes on sometimes at work when she is looking up and down or bending down.  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  Patient reports she is also getting mild dizziness intermittently when getting in or out of bed, turning her head, rolling over.  Patient states when she moves her neck she sometimes gets a lightheaded sensation.  Patient reports whenever she gets water in her ears she feels her symptoms "kind of acts up".  Patient reports that she saw Dr. Clelia Croft and that he  had reviewed her brain MRI and he stated her dizziness was caused in part due to medication per patient report.  Patient reports no changes in her medications.  Patient reports that she touches furniture at times due to dizziness.  Patient reports the past few days her dizziness has gotten worse.  Patient reports her dizziness lasts seconds.  Patient describes her dizziness as unsteadiness and lightheadedness and states she had a vertigo initially but that has resolved.  Patient's symptoms of dizziness are brought on by motion, positional, intermittent.  Patient reports that she has had episodes of  vertigo in the past that have gotten better with time.  Patient reports a family history of vertigo as well.    Patient Stated Goals To have decreased dizziness             Canalith Repositioning Maneuver: On mat table, performed left Dix-Hallpike testing and it was positive with left upbeating torsional nystagmus of short duration without latency.  Performed left Epley/canalith repositioning maneuver. Repeated left Epley maneuver for a total of 3 maneuvers with retesting between maneuvers. Patient reported 3/10 vertigo with first and 1/10 with last maneuver. Note: did not observe nystagmus with last maneuver but did for the first two maneuvers, performed Epley to try to obtain clearance of particles. Held each maneuver position for 1 minute.      PT Education - 06/04/20 1249    Education Details discussed BPPV and answered patient's questions    Person(s) Educated Patient    Methods Explanation    Comprehension Verbalized understanding            PT Short Term Goals - 05/26/20 1549      PT SHORT TERM GOAL #1   Title Patient will be independent with home exercise program in order to improve balance and decrease dizziness symptoms in order to improve function at work and home and to have decreased falls risk.    Time 4    Period Weeks    Status New    Target Date 06/23/20             PT Long Term Goals - 05/26/20 1545      PT LONG TERM GOAL #1   Title Patient will have improved FOTO score of 6 points or greater in order to demonstrate improvements in patient's ADLs and functional performance.    Baseline Scored 59/100 on 05/26/2020;    Time 8    Period Weeks    Status New    Target Date 07/21/20      PT LONG TERM GOAL #2   Title Patient will reduce falls risk as indicated by activities specific balance confidence scale (ABC) score of 69% or greater.    Baseline Scored 61.9% on 05/26/2020;    Time 8    Period Weeks    Status New    Target Date 07/21/20      PT  LONG TERM GOAL #3   Title Patient will reduce perceived disability to low levels as indicated by less than 40/100 on the dizziness handicap inventory.    Baseline Scored 62/100 on 05/26/2020;    Time 8    Period Weeks    Status New    Target Date 07/21/20      PT LONG TERM GOAL #4   Title Patient will report 50% or greater improvement in her symptoms of dizziness and imbalance overall with provoking motions and positions to be able to better perform activities at home and  at work.    Baseline Patient reports difficulty with bending activities at work and with getting in and out of bed, rolling and head turns at home;    Time 8    Period Weeks    Status New    Target Date 07/21/20                 Plan - 06/04/20 1341    Clinical Impression Statement Patient reports no episodes of vertigo since last visit, but reports imbalance and lightheadedness. Patient with positive left Dix-Hallpike this date with left upbeating torsional nystagmus without latency. Performed 3 Epley maneuvers to try to obtain clearance of particles. Plan to retest Dix-Hallpike tests next session and perform treatment maneuvers as indicated. Patient would benefit from continued PT services to further address functional deficits and goals.    Personal Factors and Comorbidities Comorbidity 3+;Past/Current Experience    Comorbidities depression, anxiety, HTN    Examination-Activity Limitations Bathing;Bed Mobility;Bend;Stairs    Examination-Participation Restrictions Cleaning;Occupation    Stability/Clinical Decision Making Evolving/Moderate complexity    Rehab Potential Good    PT Frequency 1x / week    PT Duration 8 weeks    PT Treatment/Interventions Canalith Repostioning;Balance training;Neuromuscular re-education;Therapeutic activities;Therapeutic exercise;Stair training;Gait training;Patient/family education;Vestibular    PT Next Visit Plan repeat Dix-Hallpike test and perform Epley if indicated, once  cleared, perform DGI and modified CTSIB    Consulted and Agree with Plan of Care Patient           Patient will benefit from skilled therapeutic intervention in order to improve the following deficits and impairments:  Dizziness,Decreased balance,Decreased mobility,Difficulty walking  Visit Diagnosis: Dizziness and giddiness  Unsteadiness on feet     Problem List Patient Active Problem List   Diagnosis Date Noted  . Diabetes mellitus type 2, uncomplicated ([place]) 03/50/0938  . Hyperlipidemia 12/19/2019  . Hypertension 12/19/2019  . Hyperprolactinemia ([place]) 12/21/2016  . Depression 11/12/2014  . Benign positional vertigo, unspecified laterality 10/27/2014  . Headache, unspecified headache type 10/27/2014   Lady Deutscher PT, DPT [phone] Lady Deutscher 06/04/2020, 1:44 PM  [place] MAIN [facility] SERVICES [address] [place], [place], [address] Phone: [phone]   Fax:  [phone]  Name: KALEEA PENNER MRN: [id number] Date of Birth: 06/06/1961

## 2020-06-11 ENCOUNTER — Ambulatory Visit: Payer: Medicare Other | Admitting: Physical Therapy

## 2020-06-11 ENCOUNTER — Encounter: Payer: Self-pay | Admitting: Physical Therapy

## 2020-06-11 ENCOUNTER — Other Ambulatory Visit: Payer: Self-pay

## 2020-06-11 DIAGNOSIS — R42 Dizziness and giddiness: Secondary | ICD-10-CM

## 2020-06-11 DIAGNOSIS — R2681 Unsteadiness on feet: Secondary | ICD-10-CM

## 2020-06-11 NOTE — Therapy (Signed)
[place] [facility] SERVICES [address] [place], [place], [address] Phone: [phone]   Fax:  [phone]  Physical Therapy Treatment/Discharge Summary Dates of Service: 05/26/20-06/11/20 Total Number of Visits: 3  Patient Details  Name: Monica Curtis MRN: [id number] Date of Birth: September 05, 1960 No data recorded  Encounter Date: 06/11/2020   PT End of Session - 06/11/20 1256    Visit Number 3    Number of Visits 9    Date for PT Re-Evaluation 07/21/20    Authorization Type Evaluation date: 05/26/2020    PT Start Time 1255    PT Stop Time 1343    PT Time Calculation (min) 48 min    Equipment Utilized During Treatment Gait belt    Activity Tolerance Patient tolerated treatment well    Behavior During Therapy WFL for tasks assessed/performed           Past Medical History:  Diagnosis Date  . Depression   . Diabetes mellitus without complication ([place])   . Hyperlipidemia   . Hypertension     Past Surgical History:  Procedure Laterality Date  . ABDOMINAL HYSTERECTOMY    . CESAREAN SECTION    . COLONOSCOPY    . COLONOSCOPY WITH PROPOFOL N/A 01/16/2018   Procedure: COLONOSCOPY WITH PROPOFOL;  Surgeon: Lollie Sails, MD;  Location: [facility] ENDOSCOPY;  Service: Endoscopy;  Laterality: N/A;  . ESOPHAGOGASTRODUODENOSCOPY      There were no vitals filed for this visit.   Subjective Assessment - 06/11/20 1258    Subjective Patient reports she has not had any episodes of vertigo or dizziness this past week. Patient states she has not had any more dizziness when getting into or out of bed or with rolling over in bed this past week.    Pertinent History History obtained from medical record and patient report.  Patient reports her most recent episodes of vertigo occurred in October and she was also getting dizziness and lightheadedness. Patient presented to the ENT physician's office on 03/26/2020 secondary to dizziness and was found to  have positive left BPPV and was treated with Epley maneuver per medical record.  Patient was seen again for follow-up treatment on 04/01/2020 at which time she was also found to have positive right BPPV with some central features per medical record and a brain MRI was ordered.  Brain MRI revealed normal appearance of the brain itself and no abnormality was seen per medical record.  Patient reports that during the ENT visit when performing the Epley maneuver that they found something that indicated to send her to Dr. Brigitte Pulse, neurologist.  Patient reports she had a sinus problem and was put on an antibiotic and that cleared this issue.  Patient reports in October she had 1 fall while bending forward at work and reports most recently she had a fall 2 weeks ago when she slipped going up steps at her son's home.  Patient states that in October her dizziness was bad, but states now her dizziness is very mild and it comes and goes when she is at work and at home.  Patient states her dizziness comes on sometimes at work when she is looking up and down or bending down.  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  Patient reports she is also getting mild dizziness intermittently when getting in or out of bed, turning her head, rolling over.  Patient states when she moves her neck she sometimes gets a lightheaded sensation.  Patient reports  whenever she gets water in her ears she feels her symptoms "kind of acts up".  Patient reports that she saw Dr. Brigitte Pulse and that he had reviewed her brain MRI and he stated her dizziness was caused in part due to medication per patient report.  Patient reports no changes in her medications.  Patient reports that she touches furniture at times due to dizziness.  Patient reports the past few days her dizziness has gotten worse.  Patient reports her dizziness lasts seconds.  Patient describes her dizziness as unsteadiness and lightheadedness and states she had a vertigo initially but that has resolved.   Patient's symptoms of dizziness are brought on by motion, positional, intermittent.  Patient reports that she has had episodes of vertigo in the past that have gotten better with time.  Patient reports a family history of vertigo as well.    Patient Stated Goals To have decreased dizziness             [facility] PT Assessment - 06/11/20 1316      Standardized Balance Assessment   Standardized Balance Assessment Dynamic Gait Index      Dynamic Gait Index   Level Surface Normal    Change in Gait Speed Normal    Gait with Horizontal Head Turns Normal    Gait with Vertical Head Turns Mild Impairment    Gait and Pivot Turn Normal    Step Over Obstacle Normal    Step Around Obstacles Normal    Steps Mild Impairment    Total Score 22           Performed left and right Dix-Hallpike test were negative with no nystagmus and patient denies vertigo or dizziness.  Educated provided on how to perform self-Epley maneuver at home should patient's vertigo return. Hand out provided and patient performed on mat table with pillow under trunk with cuing.   Performed DGI and patient scored 22/24 indicating normal, safe for community mobility.  Clinical Test of Sensory Interaction for Balance (CTSIB): CONDITION TIME STRATEGY SWAY  Eyes open, firm surface 30 seconds ankle +1  Eyes closed, firm surface 30 seconds ankle +2  Eyes open, foam surface 30 seconds ankle +2  Eyes closed, foam surface 15 seconds ankle +4    FUNCTIONAL OUTCOME MEASURES:  Results Comments  DHI 14/100 Low perception of handicap  ABC Scale 65.6% Falls risk; in need of intervention  DGI 22/24 Safe for community mobility  FOTO 62/100 Indicating limitations with some ADLs and functional tasks       PT Education - 06/11/20 1502    Education Details discussed self-Epley maneuver for home should her symptoms return; discussed discharge plans and functional outcome testing    Person(s) Educated Patient    Methods  Explanation;Demonstration;Verbal cues;Handout    Comprehension Verbalized understanding;Returned demonstration            PT Short Term Goals - 06/11/20 1459      PT SHORT TERM GOAL #1   Title Patient will be independent with home exercise program in order to improve balance and decrease dizziness symptoms in order to improve function at work and home and to have decreased falls risk.    Baseline discussed self-Epley maneuver and provided handout; patient performed in clinic with cuing    Time 4    Period Weeks    Status Achieved    Target Date 06/23/20             PT Long Term Goals - 06/11/20 1500  PT LONG TERM GOAL #1   Title Patient will have improved FOTO score of 6 points or greater in order to demonstrate improvements in patient's ADLs and functional performance.    Baseline Scored 59/100 on 05/26/2020; scored 62/100 on 06/11/20    Time 8    Period Weeks    Status Partially Met      PT LONG TERM GOAL #2   Title Patient will reduce falls risk as indicated by activities specific balance confidence scale (ABC) score of 69% or greater.    Baseline Scored 61.9% on 05/26/2020; scored 65/6%    Time 8    Period Weeks    Status Partially Met      PT LONG TERM GOAL #3   Title Patient will reduce perceived disability to low levels as indicated by less than 40/100 on the dizziness handicap inventory.    Baseline Scored 62/100 on 05/26/2020; scored 14/100 on 06/11/20    Time 8    Period Weeks    Status Achieved      PT LONG TERM GOAL #4   Title Patient will report 50% or greater improvement in her symptoms of dizziness and imbalance overall with provoking motions and positions to be able to better perform activities at home and at work.    Baseline Patient reports difficulty with bending activities at work and with getting in and out of bed, rolling and head turns at home; patient reports 90% improvement in her symptoms of vertigo, dizziness and imbalance on 06/11/20     Time 8    Period Weeks    Status Achieved                 Plan - 06/11/20 1635    Clinical Impression Statement Patient reports that she has not had any further episodes of vertigo or dizziness and that she has been able to get into/out of bed and roll over without dizziness now. Patient states she has been able to resume all of her normal daily activities without vertigo or dizziness. Patient with negative left and right Dix-Hallpike tests this date. Patient scored 22/24 on the DGI indicating safe for community mobility. Patient met short term goal of independence with home exercise program. Patient partially met 2/4 long term and met 2/4 long term goals as set on plan of care. Patient improved from 59/100 to 62/100 on FOTO and improved from 91% to 65.6% on the ABC scale. Patient improved from 62/100 moderate perception of handicap to 14/100 indicating low perception of handicap on the [facility]. Patient reported that she was not interested in having therapy to address balance deficits at this time and states that she was primarily wanting to do therapy to addess her vertigo. Patient reports 90% improvement in her symptoms overall. Will discharge patient from vestibular PT services at this time. Patient has home program for how to perform self-Epley if her symptoms should come back and discussed with patient that she can call the clinic if she feels she needs PT services again.    Personal Factors and Comorbidities Comorbidity 3+;Past/Current Experience    Comorbidities depression, anxiety, HTN    Examination-Activity Limitations Bathing;Bed Mobility;Bend;Stairs    Examination-Participation Restrictions Cleaning;Occupation    Stability/Clinical Decision Making Evolving/Moderate complexity    Rehab Potential Good    PT Frequency 1x / week    PT Duration 8 weeks    PT Treatment/Interventions Canalith Repostioning;Balance training;Neuromuscular re-education;Therapeutic activities;Therapeutic  exercise;Stair training;Gait training;Patient/family education;Vestibular    PT Next Visit Plan  repeat Dix-Hallpike test and perform Epley if indicated, once cleared, perform DGI and modified CTSIB    Consulted and Agree with Plan of Care Patient           Patient will benefit from skilled therapeutic intervention in order to improve the following deficits and impairments:  Dizziness,Decreased balance,Decreased mobility,Difficulty walking  Visit Diagnosis: Dizziness and giddiness  Unsteadiness on feet     Problem List Patient Active Problem List   Diagnosis Date Noted  . Diabetes mellitus type 2, uncomplicated ([place]) 37/02/6437  . Hyperlipidemia 12/19/2019  . Hypertension 12/19/2019  . Hyperprolactinemia ([place]) 12/21/2016  . Depression 11/12/2014  . Benign positional vertigo, unspecified laterality 10/27/2014  . Headache, unspecified headache type 10/27/2014   Lady Deutscher PT, DPT [phone] Lady Deutscher 06/11/2020, 4:46 PM  [place] MAIN [facility] SERVICES [address] [place], [place], [address] Phone: [phone]   Fax:  [phone]  Name: KYNDALL AMERO MRN: [id number] Date of Birth: 17-Aug-1960

## 2020-06-16 ENCOUNTER — Ambulatory Visit: Payer: Medicare Other | Admitting: Physical Therapy

## 2020-06-19 ENCOUNTER — Other Ambulatory Visit: Payer: Medicare Other

## 2020-06-23 ENCOUNTER — Ambulatory Visit: Payer: Medicare Other | Admitting: Physical Therapy

## 2020-07-07 ENCOUNTER — Other Ambulatory Visit: Payer: Medicare Other

## 2020-07-07 DIAGNOSIS — Z20822 Contact with and (suspected) exposure to covid-19: Secondary | ICD-10-CM

## 2020-07-08 LAB — NOVEL CORONAVIRUS, NAA: SARS-CoV-2, NAA: NOT DETECTED

## 2020-07-08 LAB — SARS-COV-2, NAA 2 DAY TAT

## 2020-12-22 ENCOUNTER — Encounter: Payer: Self-pay | Admitting: Internal Medicine

## 2020-12-23 ENCOUNTER — Encounter
Admission: RE | Disposition: A | Discharge status: Home / Self Care | Payer: Self-pay | Source: Home / Self Care | Attending: Internal Medicine

## 2020-12-23 ENCOUNTER — Ambulatory Visit: Payer: Medicare Other | Admitting: Anesthesiology

## 2020-12-23 ENCOUNTER — Ambulatory Visit
Admission: RE | Admit: 2020-12-23 | Discharge: 2020-12-23 | Disposition: A | Discharge status: Home / Self Care | Payer: Medicare Other | Attending: Internal Medicine | Admitting: Internal Medicine

## 2020-12-23 ENCOUNTER — Other Ambulatory Visit: Payer: Self-pay

## 2020-12-23 ENCOUNTER — Encounter: Payer: Self-pay | Admitting: Internal Medicine

## 2020-12-23 DIAGNOSIS — Z882 Allergy status to sulfonamides status: Secondary | ICD-10-CM | POA: Insufficient documentation

## 2020-12-23 DIAGNOSIS — Z79899 Other long term (current) drug therapy: Secondary | ICD-10-CM | POA: Diagnosis not present

## 2020-12-23 DIAGNOSIS — K640 First degree hemorrhoids: Secondary | ICD-10-CM | POA: Insufficient documentation

## 2020-12-23 DIAGNOSIS — Z86010 Personal history of colonic polyps: Secondary | ICD-10-CM | POA: Diagnosis present

## 2020-12-23 DIAGNOSIS — Z1211 Encounter for screening for malignant neoplasm of colon: Secondary | ICD-10-CM | POA: Diagnosis not present

## 2020-12-23 HISTORY — PX: COLONOSCOPY WITH PROPOFOL: SHX5780

## 2020-12-23 LAB — GLUCOSE, CAPILLARY: Glucose-Capillary: 102 mg/dL — ABNORMAL HIGH (ref 70–99)

## 2020-12-23 SURGERY — COLONOSCOPY WITH PROPOFOL
Anesthesia: General

## 2020-12-23 MED ORDER — SODIUM CHLORIDE 0.9 % IV SOLN: INTRAVENOUS | Status: DC

## 2020-12-23 MED ORDER — PROPOFOL 500 MG/50ML IV EMUL
INTRAVENOUS | Status: DC | PRN
  Administered 2020-12-23: 120 ug/kg/min via INTRAVENOUS

## 2020-12-23 MED ORDER — PROPOFOL 500 MG/50ML IV EMUL
INTRAVENOUS | Status: AC
  Filled 2020-12-23: qty 50

## 2020-12-23 NOTE — Anesthesia Procedure Notes (Signed)
Date/Time: 12/23/2020 2:11 PM Performed by: Vaughan Sine Pre-anesthesia Checklist: Patient identified, Emergency Drugs available, Suction available, Patient being monitored and Timeout performed Patient Re-evaluated:Patient Re-evaluated prior to induction Oxygen Delivery Method: Nasal cannula Preoxygenation: Pre-oxygenation with 100% oxygen Induction Type: IV induction Placement Confirmation: positive ETCO2 and CO2 detector

## 2020-12-23 NOTE — Anesthesia Preprocedure Evaluation (Signed)
Anesthesia Evaluation  Patient identified by MRN, date of birth, ID band Patient awake    Reviewed: Allergy & Precautions, H&P , NPO status , Patient's Chart, lab work & pertinent test results, reviewed documented beta blocker date and time   History of Anesthesia Complications Negative for: history of anesthetic complications  Airway Mallampati: III  TM Distance: >3 FB Neck ROM: full    Dental  (+) Implants, Dental Advidsory Given, Caps, Chipped   Pulmonary neg pulmonary ROS,    Pulmonary exam normal        Cardiovascular Exercise Tolerance: Good hypertension, (-) angina(-) CAD, (-) Past MI, (-) Cardiac Stents and (-) CABG Normal cardiovascular exam(-) dysrhythmias (-) Valvular Problems/Murmurs     Neuro/Psych  Headaches, PSYCHIATRIC DISORDERS Depression    GI/Hepatic negative GI ROS, Neg liver ROS,   Endo/Other  diabetes, Well Controlled  Renal/GU negative Renal ROS  negative genitourinary   Musculoskeletal   Abdominal   Peds  Hematology negative hematology ROS (+)   Anesthesia Other Findings . Depression  . Diabetes mellitus type 2, uncomplicated (CMS-HCC)  . Hyperlipidemia  . Hypertension  . Tremor   Reproductive/Obstetrics negative OB ROS                             Anesthesia Physical  Anesthesia Plan  ASA: 2  Anesthesia Plan: General   Post-op Pain Management:    Induction: Intravenous  PONV Risk Score and Plan: 3 and Propofol infusion and TIVA  Airway Management Planned: Nasal Cannula and Natural Airway  Additional Equipment:   Intra-op Plan:   Post-operative Plan:   Informed Consent: I have reviewed the patients History and Physical, chart, labs and discussed the procedure including the risks, benefits and alternatives for the proposed anesthesia with the patient or authorized representative who has indicated his/her understanding and acceptance.     Dental  Advisory Given  Plan Discussed with: Anesthesiologist, CRNA and Surgeon  Anesthesia Plan Comments:         Anesthesia Quick Evaluation

## 2020-12-23 NOTE — Transfer of Care (Signed)
Immediate Anesthesia Transfer of Care Note  Patient: Monica Curtis  Procedure(s) Performed: COLONOSCOPY WITH PROPOFOL  Patient Location: PACU  Anesthesia Type:General  Level of Consciousness: awake and sedated  Airway & Oxygen Therapy: Patient Spontanous Breathing and Patient connected to nasal cannula oxygen  Post-op Assessment: Report given to RN and Post -op Vital signs reviewed and stable  Post vital signs: Reviewed and stable  Last Vitals:  Vitals Value Taken Time  BP    Temp    Pulse    Resp    SpO2      Last Pain:  Vitals:   12/23/20 1333  TempSrc: Temporal  PainSc: 0-No pain         Complications: No notable events documented.

## 2020-12-23 NOTE — Op Note (Signed)
[facility] Patient Name: Monica Curtis Procedure Date: 12/23/2020 2:03 PM MRN: [id number] Account #: [id number] Date of Birth: 01-Jan-1961 Admit Type: Outpatient Age: 60 Room: [facility] ENDO ROOM 4 Gender: Female Note Status: Finalized Procedure:             Colonoscopy Indications:           Surveillance: Personal history of adenomatous polyps                         on last colonoscopy 3 years ago Providers:             Lorie Apley K. [name] MD, MD Referring MD:          Youlanda Roys. Lovie Macadamia, MD (Referring MD) Medicines:             Propofol per Anesthesia Complications:         No immediate complications. Procedure:             Pre-Anesthesia Assessment:                        - The risks and benefits of the procedure and the                         sedation options and risks were discussed with the                         patient. All questions were answered and informed                         consent was obtained.                        - Patient identification and proposed procedure were                         verified prior to the procedure by the nurse. The                         procedure was verified in the procedure room.                        - ASA Grade Assessment: III - A patient with severe                         systemic disease.                        - After reviewing the risks and benefits, the patient                         was deemed in satisfactory condition to undergo the                         procedure.                        After obtaining informed consent, the colonoscope was                         passed under direct vision.  Throughout the procedure,                         the patient's blood pressure, pulse, and oxygen                         saturations were monitored continuously. The                         Colonoscope was introduced through the anus and                         advanced to the the cecum, identified  by appendiceal                         orifice and ileocecal valve. The colonoscopy was                         somewhat difficult due to restricted mobility of the                         colon. Successful completion of the procedure was                         aided by applying abdominal pressure. The patient                         tolerated the procedure well. The quality of the bowel                         preparation was good. The ileocecal valve, appendiceal                         orifice, and rectum were photographed. Findings:      The perianal and digital rectal examinations were normal. Pertinent       negatives include normal sphincter tone and no palpable rectal lesions.      Non-bleeding internal hemorrhoids were found during retroflexion. The       hemorrhoids were Grade I (internal hemorrhoids that do not prolapse).      The colon (entire examined portion) appeared normal. Impression:            - Non-bleeding internal hemorrhoids.                        - The entire examined colon is normal.                        - No specimens collected. Recommendation:        - Patient has a contact number available for                         emergencies. The signs and symptoms of potential                         delayed complications were discussed with the patient.                         Return to normal activities tomorrow. Written  discharge instructions were provided to the patient.                        - Resume previous diet.                        - Continue present medications.                        - Repeat colonoscopy in 5 years for surveillance.                        - Return to GI office PRN.                        - The findings and recommendations were discussed with                         the patient. Procedure Code(s):     --- Professional ---                        G1829, Colorectal cancer screening; colonoscopy on                          individual at high risk Diagnosis Code(s):     --- Professional ---                        K64.0, First degree hemorrhoids                        Z86.010, Personal history of colonic polyps CPT copyright 2019 American Medical Association. All rights reserved. The codes documented in this report are preliminary and upon coder review may  be revised to meet current compliance requirements. Efrain Sella MD, MD 12/23/2020 2:32:51 PM This report has been signed electronically. Number of Addenda: 0 Note Initiated On: 12/23/2020 2:03 PM Scope Withdrawal Time: 0 hours 5 minutes 51 seconds  Total Procedure Duration: 0 hours 11 minutes 30 seconds  Estimated Blood Loss:  Estimated blood loss: none.      [facility]

## 2020-12-23 NOTE — Interval H&P Note (Signed)
History and Physical Interval Note:  12/23/2020 1:36 PM  Monica Curtis  has presented today for surgery, with the diagnosis of history of polyps.  The various methods of treatment have been discussed with the patient and family. After consideration of risks, benefits and other options for treatment, the patient has consented to  Procedure(s): COLONOSCOPY WITH PROPOFOL (N/A) as a surgical intervention.  The patient's history has been reviewed, patient examined, no change in status, stable for surgery.  I have reviewed the patient's chart and labs.  Questions were answered to the patient's satisfaction.     [place], [place]

## 2020-12-23 NOTE — H&P (Signed)
Outpatient short stay form Pre-procedure 12/23/2020 1:34 PM Monica Curtis K. Monica Curtis, M.D.  Primary Physician: Juluis Pitch, M.D.  Reason for visit:  Personal history of adenomatous colon polyps - 01/16/2018 - Colonoscopy.  History of present illness:                            Patient presents for colonoscopy for a personal hx of colon polyps. The patient denies abdominal pain, abnormal weight loss or rectal bleeding.      Current Facility-Administered Medications:    0.9 %  sodium chloride infusion, , Intravenous, Continuous, Monica Curtis, Monica Pike, MD  Medications Prior to Admission  Medication Sig Dispense Refill Last Dose   albuterol (PROVENTIL HFA;VENTOLIN HFA) 108 (90 Base) MCG/ACT inhaler Inhale 2 puffs into the lungs every 6 (six) hours as needed for wheezing or shortness of breath. (Patient not taking: Reported on 05/26/2020)      cabergoline (DOSTINEX) 0.5 MG tablet Take 0.5 mg by mouth 3 (three) times a week.      cetirizine (ZYRTEC) 10 MG tablet Take 10 mg by mouth daily. (Patient not taking: Reported on 05/26/2020)      clonazePAM (KLONOPIN) 1 MG tablet Take 1 mg by mouth at bedtime.      hydrocortisone (ANUSOL-HC) 2.5 % rectal cream Place 1 application rectally 3 (three) times daily.      lamoTRIgine (LAMICTAL) 200 MG tablet Take 200 mg by mouth 2 (two) times daily.      meclizine (ANTIVERT) 25 MG tablet Take 25 mg by mouth 3 (three) times daily as needed for dizziness.      paliperidone (INVEGA SUSTENNA) 156 MG/ML SUSY injection Inject 156 mg into the muscle every 28 (twenty-eight) days.      sertraline (ZOLOFT) 100 MG tablet Take 100 mg by mouth 2 (two) times daily.      simvastatin (ZOCOR) 20 MG tablet Take 20 mg by mouth daily at 6 PM.      traMADol (ULTRAM) 50 MG tablet Take by mouth. (Patient not taking: Reported on 05/26/2020)        Allergies  Allergen Reactions   Sulfa Antibiotics Hives     Past Medical History:  Diagnosis Date   Depression    Diabetes mellitus  without complication ([place])    Hyperlipidemia    Hypertension     Review of systems:  Otherwise negative.    Physical Exam  Gen: Alert, oriented. Appears stated age.  HEENT: [place]/AT. PERRLA. Lungs: CTA, no wheezes. CV: RR nl S1, S2. Abd: soft, benign, no masses. BS+ Ext: No edema. Pulses 2+    Planned procedures: Proceed with colonoscopy. The patient understands the nature of the planned procedure, indications, risks, alternatives and potential complications including but not limited to bleeding, infection, perforation, damage to internal organs and possible oversedation/side effects from anesthesia. The patient agrees and gives consent to proceed.  Please refer to procedure notes for findings, recommendations and patient disposition/instructions.     Monica Curtis, M.D. Gastroenterology 12/23/2020  1:34 PM

## 2020-12-24 ENCOUNTER — Encounter: Payer: Self-pay | Admitting: Internal Medicine

## 2020-12-24 NOTE — Anesthesia Postprocedure Evaluation (Signed)
Anesthesia Post Note  Patient: Monica Curtis  Procedure(s) Performed: COLONOSCOPY WITH PROPOFOL  Patient location during evaluation: Phase II Anesthesia Type: General Level of consciousness: awake and alert, awake and oriented Pain management: pain level controlled Vital Signs Assessment: post-procedure vital signs reviewed and stable Respiratory status: spontaneous breathing, nonlabored ventilation and respiratory function stable Cardiovascular status: blood pressure returned to baseline and stable Postop Assessment: no apparent nausea or vomiting Anesthetic complications: no   No notable events documented.   Last Vitals:  Vitals:   12/23/20 1431 12/23/20 1501  BP: 136/76 (!) 159/82  Pulse:    Resp:    Temp: (!) 36.2 C   SpO2: 96%     Last Pain:  Vitals:   12/23/20 1501  TempSrc:   PainSc: 0-No pain                 Phill Mutter

## 2021-01-05 ENCOUNTER — Ambulatory Visit: Admit: 2021-01-05 | Payer: Medicare Other

## 2021-01-05 SURGERY — COLONOSCOPY WITH PROPOFOL
Anesthesia: General

## 2021-08-19 ENCOUNTER — Other Ambulatory Visit: Payer: Self-pay | Admitting: Family Medicine

## 2021-08-19 DIAGNOSIS — Z1231 Encounter for screening mammogram for malignant neoplasm of breast: Secondary | ICD-10-CM

## 2021-10-05 ENCOUNTER — Ambulatory Visit
Admission: RE | Admit: 2021-10-05 | Discharge: 2021-10-05 | Disposition: A | Discharge status: Home / Self Care | Payer: Medicare Other | Source: Ambulatory Visit | Attending: Family Medicine | Admitting: Family Medicine

## 2021-10-05 DIAGNOSIS — Z1231 Encounter for screening mammogram for malignant neoplasm of breast: Secondary | ICD-10-CM | POA: Diagnosis present

## 2021-12-01 NOTE — Progress Notes (Unsigned)
Psychiatric Initial Adult Assessment   Patient Identification: Monica Curtis MRN:  [id number] Date of Evaluation:  12/01/2021 Referral Source: *** Chief Complaint:  No chief complaint on file.  Visit Diagnosis: No diagnosis found.  History of Present Illness:   Monica Curtis is a 61 y.o. year old female with a history of schizoaffective disorder, depression, type II diabetes, depression, hypertension, hyperlipidemia, who is referred for schizoaffective disorder.   Daily routine: Diet:  Exercise: Support: Household:  Marital status: Number of children: Employment:  Education:   Last PCP / ongoing medical evaluation:    Associated Signs/Symptoms: Depression Symptoms:  {DEPRESSION SYMPTOMS:20000} (Hypo) Manic Symptoms:  {BHH MANIC SYMPTOMS:22872} Anxiety Symptoms:  {BHH ANXIETY SYMPTOMS:22873} Psychotic Symptoms:  {BHH PSYCHOTIC SYMPTOMS:22874} PTSD Symptoms: {BHH PTSD SYMPTOMS:22875}  Past Psychiatric History:  Outpatient:  Psychiatry admission:  Previous suicide attempt:  Past trials of medication:  History of violence:    Previous Psychotropic Medications: {YES/NO:21197}  Substance Abuse History in the last 12 months:  {yes no:314532}  Consequences of Substance Abuse: {BHH CONSEQUENCES OF SUBSTANCE ABUSE:22880}  Past Medical History:  Past Medical History:  Diagnosis Date   Depression    Diabetes mellitus without complication ([place])    Hyperlipidemia    Hypertension     Past Surgical History:  Procedure Laterality Date   ABDOMINAL HYSTERECTOMY     CESAREAN SECTION     COLONOSCOPY     COLONOSCOPY WITH PROPOFOL N/A 01/16/2018   Procedure: COLONOSCOPY WITH PROPOFOL;  Surgeon: Lollie Sails, MD;  Location[facility] ENDOSCOPY;  Service: Endoscopy;  Laterality: N/A;   COLONOSCOPY WITH PROPOFOL N/A 12/23/2020   Procedure: COLONOSCOPY WITH PROPOFOL;  Surgeon: Toledo, Benay Pike, MD;  Location: [facility] ENDOSCOPY;  Service: Gastroenterology;  Laterality: N/A;    ESOPHAGOGASTRODUODENOSCOPY      Family Psychiatric History: ***  Family History:  Family History  Problem Relation Age of Onset   Breast cancer Paternal Grandmother    Breast cancer Sister     Social History:   Social History   Socioeconomic History   Marital status: Divorced    Spouse name: Not on file   Number of children: Not on file   Years of education: Not on file   Highest education level: Not on file  Occupational History   Not on file  Tobacco Use   Smoking status: Never   Smokeless tobacco: Never  Vaping Use   Vaping Use: Never used  Substance and Sexual Activity   Alcohol use: Not Currently   Drug use: Never   Sexual activity: Not on file  Other Topics Concern   Not on file  Social History Narrative   Not on file   Social Determinants of Health   Financial Resource Strain: Not on file  Food Insecurity: Not on file  Transportation Needs: Not on file  Physical Activity: Not on file  Stress: Not on file  Social Connections: Not on file    Additional Social History: ***  Allergies:   Allergies  Allergen Reactions   Sulfa Antibiotics Hives    Metabolic Disorder Labs: No results found for: "HGBA1C", "MPG" No results found for: "PROLACTIN" No results found for: "CHOL", "TRIG", "HDL", "CHOLHDL", "VLDL", "LDLCALC" No results found for: "TSH"  Therapeutic Level Labs: No results found for: "LITHIUM" No results found for: "CBMZ" No results found for: "VALPROATE"  Current Medications: Current Outpatient Medications  Medication Sig Dispense Refill   albuterol (PROVENTIL HFA;VENTOLIN HFA) 108 (90 Base) MCG/ACT inhaler Inhale 2 puffs into the lungs  every 6 (six) hours as needed for wheezing or shortness of breath. (Patient not taking: Reported on 05/26/2020)     cabergoline (DOSTINEX) 0.5 MG tablet Take 0.5 mg by mouth 3 (three) times a week.     cetirizine (ZYRTEC) 10 MG tablet Take 10 mg by mouth daily. (Patient not taking: Reported on 05/26/2020)      clonazePAM (KLONOPIN) 1 MG tablet Take 1 mg by mouth at bedtime.     hydrocortisone (ANUSOL-HC) 2.5 % rectal cream Place 1 application rectally 3 (three) times daily.     lamoTRIgine (LAMICTAL) 200 MG tablet Take 200 mg by mouth 2 (two) times daily.     meclizine (ANTIVERT) 25 MG tablet Take 25 mg by mouth 3 (three) times daily as needed for dizziness.     paliperidone (INVEGA SUSTENNA) 156 MG/ML SUSY injection Inject 156 mg into the muscle every 28 (twenty-eight) days.     sertraline (ZOLOFT) 100 MG tablet Take 100 mg by mouth 2 (two) times daily.     simvastatin (ZOCOR) 20 MG tablet Take 20 mg by mouth daily at 6 PM.     traMADol (ULTRAM) 50 MG tablet Take by mouth. (Patient not taking: Reported on 05/26/2020)     No current facility-administered medications for this visit.    Musculoskeletal: Strength & Muscle Tone: within normal limits Gait & Station: normal Patient leans: N/A  Psychiatric Specialty Exam: Review of Systems  There were no vitals taken for this visit.There is no height or weight on file to calculate BMI.  General Appearance: {Appearance:22683}  Eye Contact:  {BHH EYE CONTACT:22684}  Speech:  Clear and Coherent  Volume:  Normal  Mood:  {BHH MOOD:22306}  Affect:  {Affect (PAA):22687}  Thought Process:  Coherent  Orientation:  Full (Time, Place, and Person)  Thought Content:  Logical  Suicidal Thoughts:  {ST/HT (PAA):22692}  Homicidal Thoughts:  {ST/HT (PAA):22692}  Memory:  Immediate;   Good  Judgement:  {Judgement (PAA):22694}  Insight:  {Insight (PAA):22695}  Psychomotor Activity:  Normal  Concentration:  Concentration: Good and Attention Span: Good  Recall:  Good  Fund of Knowledge:Good  Language: Good  Akathisia:  No  Handed:  Right  AIMS (if indicated):  not done  Assets:  Communication Skills Desire for Improvement  ADL's:  Intact  Cognition: WNL  Sleep:  {BHH GOOD/FAIR/POOR:22877}   Screenings: Flowsheet Row Admission (Discharged) from  12/23/2020 in [place] No Risk       Assessment and Plan:  Assessment  Plan   The patient demonstrates the following risk factors for suicide: Chronic risk factors for suicide include: {Chronic Risk Factors for IONGEXB:28413244}. Acute risk factors for suicide include: {Acute Risk Factors for WNUUVOZ:36644034}. Protective factors for this patient include: {Protective Factors for Suicide VQQV:95638756}. Considering these factors, the overall suicide risk at this point appears to be {Desc; low/moderate/high:110033}. Patient {ACTION; IS/IS EPP:29518841} appropriate for outpatient follow up.   Collaboration of Care: {BH OP Collaboration of Care:21014065}  Patient/Guardian was advised Release of Information must be obtained prior to any record release in order to collaborate their care with an outside provider. Patient/Guardian was advised if they have not already done so to contact the registration department to sign all necessary forms in order for [place] to release information regarding their care.   Consent: Patient/Guardian gives verbal consent for treatment and assignment of benefits for services provided during this visit. Patient/Guardian expressed understanding and agreed to proceed.   Norman Clay, MD  6/21/20231:59 PM

## 2021-12-07 ENCOUNTER — Ambulatory Visit (INDEPENDENT_AMBULATORY_CARE_PROVIDER_SITE_OTHER): Payer: Medicare Other | Admitting: Psychiatry

## 2021-12-07 ENCOUNTER — Encounter: Payer: Self-pay | Admitting: Psychiatry

## 2021-12-07 VITALS — BP 149/79 | HR 72 | Temp 98.7°F | Wt 169.2 lb

## 2021-12-07 DIAGNOSIS — F251 Schizoaffective disorder, depressive type: Secondary | ICD-10-CM

## 2021-12-07 DIAGNOSIS — R5383 Other fatigue: Secondary | ICD-10-CM | POA: Diagnosis not present

## 2021-12-07 DIAGNOSIS — F411 Generalized anxiety disorder: Secondary | ICD-10-CM | POA: Diagnosis not present

## 2021-12-07 MED ORDER — INVEGA SUSTENNA 234 MG/1.5ML IM SUSY: 234.0000 mg | PREFILLED_SYRINGE | INTRAMUSCULAR | 5 refills | Status: DC

## 2021-12-07 MED ORDER — PALIPERIDONE PALMITATE ER 234 MG/1.5ML IM SUSY
234.0000 mg | PREFILLED_SYRINGE | INTRAMUSCULAR | Status: AC
  Administered 2022-01-06 – 2022-06-08 (×6): 234 mg via INTRAMUSCULAR

## 2021-12-07 MED ORDER — INVEGA SUSTENNA 234 MG/1.5ML IM SUSY: 234.0000 mg | PREFILLED_SYRINGE | INTRAMUSCULAR | 0 refills | Status: DC
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
## 2022-01-06 ENCOUNTER — Ambulatory Visit (INDEPENDENT_AMBULATORY_CARE_PROVIDER_SITE_OTHER): Payer: Medicare Other | Admitting: Psychiatry

## 2022-01-06 ENCOUNTER — Ambulatory Visit (INDEPENDENT_AMBULATORY_CARE_PROVIDER_SITE_OTHER): Payer: Medicare Other

## 2022-01-06 ENCOUNTER — Encounter: Payer: Self-pay | Admitting: Psychiatry

## 2022-01-06 VITALS — BP 147/73 | HR 77 | Temp 98.7°F | Ht 64.25 in | Wt 171.0 lb

## 2022-01-06 DIAGNOSIS — F411 Generalized anxiety disorder: Secondary | ICD-10-CM | POA: Diagnosis not present

## 2022-01-06 DIAGNOSIS — F251 Schizoaffective disorder, depressive type: Secondary | ICD-10-CM

## 2022-01-06 DIAGNOSIS — G2401 Drug induced subacute dyskinesia: Secondary | ICD-10-CM | POA: Diagnosis not present

## 2022-01-06 MED ORDER — BUPROPION HCL ER (XL) 150 MG PO TB24: 150.0000 mg | ORAL_TABLET | Freq: Every day | ORAL | 0 refills | Status: DC

## 2022-01-06 NOTE — Progress Notes (Signed)
Patient presented for due Invega Sustenna 234 mg/1.5 ml IM injection every 28 days.  Patient with flat affect, level and pleasant mood and denied any current auditory or visual hallucinations, no suicidal or homicidal ideations and no plans or intent to want to harm self or others at this time.  Patient reported plan to see Dr. Modesta Messing today and reviewed all medications.  Patient stated she had been doing well and was pleased with current medication regimen.  Injection prepared as ordered and given to patient in her right deltoid area.  Patient tolerated due injection without complaint of pain or discomfort.  Will see Dr. Modesta Messing today and return in 4 weeks for next due injection.

## 2022-01-27 ENCOUNTER — Telehealth: Payer: Self-pay

## 2022-01-27 ENCOUNTER — Other Ambulatory Visit: Payer: Self-pay | Admitting: Psychiatry

## 2022-01-27 MED ORDER — CLONAZEPAM 0.5 MG PO TABS: 0.2500 mg | ORAL_TABLET | Freq: Every evening | ORAL | 0 refills | Status: DC | PRN

## 2022-01-27 NOTE — Telephone Encounter (Signed)
Ordered.   I have utilized the [place] Controlled Substances Reporting System (PMP AWARxE) to confirm adherence regarding the patient's medication. My review reveals appropriate prescription fills.

## 2022-01-27 NOTE — Telephone Encounter (Signed)
Pt left a message that she needed a refill on the clonazepam .'25mg'$      clonazePAM (KLONOPIN) 0.25 MG disintegrating tablet Medication Date: 01/06/2022 Department: [facility] Documenting: Watt Climes, RN Authorizing: [provider]   Order Providers  Prescribing Provider Encounter Provider  [provider] Watt Climes, RN   Outpatient Medication Detail   Disp Refills Start End   clonazePAM (KLONOPIN) 0.25 MG disintegrating tablet   12/16/2021    Sig - Route: Take 0.25 mg by mouth at bedtime. - Oral   Class: Historical Med

## 2022-01-28 ENCOUNTER — Other Ambulatory Visit: Payer: Self-pay | Admitting: Psychiatry
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
## 2022-02-03 ENCOUNTER — Encounter: Payer: Self-pay | Admitting: Psychiatry

## 2022-02-03 ENCOUNTER — Ambulatory Visit (INDEPENDENT_AMBULATORY_CARE_PROVIDER_SITE_OTHER): Payer: Medicare Other

## 2022-02-03 ENCOUNTER — Ambulatory Visit (INDEPENDENT_AMBULATORY_CARE_PROVIDER_SITE_OTHER): Payer: Medicare Other | Admitting: Psychiatry

## 2022-02-03 VITALS — BP 155/64 | HR 65 | Temp 98.7°F | Wt 171.6 lb

## 2022-02-03 DIAGNOSIS — F411 Generalized anxiety disorder: Secondary | ICD-10-CM | POA: Diagnosis not present

## 2022-02-03 DIAGNOSIS — G2401 Drug induced subacute dyskinesia: Secondary | ICD-10-CM | POA: Diagnosis not present

## 2022-02-03 DIAGNOSIS — F251 Schizoaffective disorder, depressive type: Secondary | ICD-10-CM

## 2022-02-03 MED ORDER — BUPROPION HCL ER (XL) 150 MG PO TB24: 150.0000 mg | ORAL_TABLET | Freq: Every day | ORAL | 1 refills | Status: DC

## 2022-02-03 MED ORDER — CLONAZEPAM 0.25 MG PO TBDP: 0.2500 mg | ORAL_TABLET | Freq: Every day | ORAL | 1 refills | Status: DC | PRN

## 2022-02-03 NOTE — Patient Instructions (Signed)
Patient presented for due Invega Sustenna 234 mg/1.5 ml IM injection every 28 days.  Patient with flat affect, level and pleasant mood and denied any current auditory or visual hallucinations, no suicidal or homicidal ideations and no plans or intent to want to harm self or others at this time.    Patient stated she had been doing well and was pleased with current medication regimen.    Injection prepared as ordered and given to patient in her left deltoid area.  Patient tolerated injection without complaint of pain or discomfort.  GTTN: # [id number]  S/N:  [id number]  exp  08-12-2023 Lot# [id number]    Pt to return in 28 days.

## 2022-02-15 ENCOUNTER — Institutional Professional Consult (permissible substitution): Payer: Medicare Other | Admitting: Adult Health

## 2022-02-28 ENCOUNTER — Other Ambulatory Visit: Payer: Self-pay | Admitting: Psychiatry

## 2022-03-03 ENCOUNTER — Ambulatory Visit: Payer: Medicare Other

## 2022-03-07 ENCOUNTER — Ambulatory Visit (INDEPENDENT_AMBULATORY_CARE_PROVIDER_SITE_OTHER): Payer: Medicare Other

## 2022-03-07 DIAGNOSIS — F251 Schizoaffective disorder, depressive type: Secondary | ICD-10-CM

## 2022-03-07 NOTE — Patient Instructions (Signed)
Patient presented for due Invega Sustenna 234 mg/1.5 ml IM injection every 28 days.  Patient with flat affect, level and pleasant mood and denied any current auditory or visual hallucinations, no suicidal or homicidal ideations and no plans or intent to want to harm self or others at this time.     Patient stated she had been doing well and was pleased with current medication regimen.     Injection prepared as ordered and given to patient in her right deltoid area.  Patient tolerated injection without complaint of pain or discomfort.  GTTN: # [id number] S/N:  [id number] exp  08-12-2023 Lot# [id number]     Pt to return in 28 days.
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
## 2022-03-31 ENCOUNTER — Encounter: Payer: Self-pay | Admitting: Psychiatry

## 2022-03-31 ENCOUNTER — Ambulatory Visit (INDEPENDENT_AMBULATORY_CARE_PROVIDER_SITE_OTHER): Payer: Medicare Other | Admitting: Psychiatry

## 2022-03-31 VITALS — BP 145/83 | HR 79 | Temp 99.2°F | Ht 64.25 in | Wt 169.6 lb

## 2022-03-31 DIAGNOSIS — F411 Generalized anxiety disorder: Secondary | ICD-10-CM | POA: Diagnosis not present

## 2022-03-31 DIAGNOSIS — G2401 Drug induced subacute dyskinesia: Secondary | ICD-10-CM | POA: Diagnosis not present

## 2022-03-31 DIAGNOSIS — F251 Schizoaffective disorder, depressive type: Secondary | ICD-10-CM

## 2022-04-06 ENCOUNTER — Ambulatory Visit (INDEPENDENT_AMBULATORY_CARE_PROVIDER_SITE_OTHER): Payer: Medicare Other

## 2022-04-06 DIAGNOSIS — F251 Schizoaffective disorder, depressive type: Secondary | ICD-10-CM | POA: Diagnosis not present

## 2022-04-06 DIAGNOSIS — F32A Depression, unspecified: Secondary | ICD-10-CM

## 2022-04-06 NOTE — Patient Instructions (Signed)
Patient presented for due Invega Sustenna 234 mg/1.5 ml IM injection every 28 days.  Patient with flat affect, level and pleasant mood and denied any current auditory or visual hallucinations, no suicidal or homicidal ideations and no plans or intent to want to harm self or others at this time.  Patient stated she had been doing well and was pleased with current medication regimen.     Injection prepared as ordered and given to patient in her right deltoid area.  Patient tolerated injection without complaint of pain or discomfort.  GTTNMarland Kitchen [id number] S /N: [id number] exp  09-12-2023  Lot# [id number].   Pt to return in 28 days.

## 2022-04-09 ENCOUNTER — Other Ambulatory Visit: Payer: Self-pay | Admitting: Psychiatry

## 2022-04-11 NOTE — Telephone Encounter (Signed)
Defer to Dr.Hisada

## 2022-04-19 ENCOUNTER — Other Ambulatory Visit: Payer: Self-pay | Admitting: Psychiatry
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
## 2022-05-09 ENCOUNTER — Encounter: Payer: Self-pay | Admitting: Psychiatry

## 2022-05-09 ENCOUNTER — Ambulatory Visit (INDEPENDENT_AMBULATORY_CARE_PROVIDER_SITE_OTHER): Payer: Medicare Other

## 2022-05-09 ENCOUNTER — Ambulatory Visit (INDEPENDENT_AMBULATORY_CARE_PROVIDER_SITE_OTHER): Payer: Medicare Other | Admitting: Psychiatry

## 2022-05-09 VITALS — BP 145/71 | HR 71 | Temp 98.3°F | Ht 64.25 in | Wt 170.8 lb

## 2022-05-09 DIAGNOSIS — G2401 Drug induced subacute dyskinesia: Secondary | ICD-10-CM

## 2022-05-09 DIAGNOSIS — F251 Schizoaffective disorder, depressive type: Secondary | ICD-10-CM | POA: Diagnosis not present

## 2022-05-09 DIAGNOSIS — F411 Generalized anxiety disorder: Secondary | ICD-10-CM

## 2022-05-09 DIAGNOSIS — F32A Depression, unspecified: Secondary | ICD-10-CM

## 2022-05-09 MED ORDER — CLONAZEPAM 0.5 MG PO TABS: 0.2500 mg | ORAL_TABLET | Freq: Every evening | ORAL | 1 refills | Status: DC | PRN

## 2022-05-09 NOTE — Patient Instructions (Signed)
  patient here today for her  Kirt Boys 234 mg/1.5 ml IM injection every 28 days.  Patient with flat affect, level and pleasant mood and denied any current auditory or visual hallucinations, no suicidal or homicidal ideations and no plans or intent to want to harm self or others at this time.  Patient stated she had been doing well and was pleased with current medication regimen.     Injection prepared as ordered and given to patient in her right deltoid area.  Patient tolerated injection without complaint of pain or discomfort.  GTTNMarland Kitchen [id number] S /N: [id number] exp  10-12-2023  Lot# [id number].   Pt to return in 28 days.

## 2022-05-09 NOTE — Patient Instructions (Signed)
Continue Invega sustenna 234 mg IM q28 day  Increase bupropion 300 mg daily  Continue lamotrigine 400 mg daily Continue sertraline 200 mg daily Continue clonazepam 0.25 mg at night  Next appointment: 1/25 at 1:30

## 2022-05-12 ENCOUNTER — Other Ambulatory Visit: Payer: Self-pay | Admitting: Psychiatry

## 2022-05-12 NOTE — Telephone Encounter (Signed)
Defer to Dr.Hisada

## 2022-05-27 ENCOUNTER — Telehealth: Payer: Self-pay

## 2022-05-27 ENCOUNTER — Other Ambulatory Visit: Payer: Self-pay | Admitting: Psychiatry

## 2022-05-27 MED ORDER — BUPROPION HCL ER (XL) 300 MG PO TB24: 300.0000 mg | ORAL_TABLET | Freq: Every day | ORAL | 1 refills | Status: DC

## 2022-05-27 NOTE — Telephone Encounter (Signed)
pt called states that the pharmacy does not have the bupropion rx please send the prescribition .

## 2022-05-27 NOTE — Telephone Encounter (Signed)
Ordered

## 2022-06-08 ENCOUNTER — Ambulatory Visit (INDEPENDENT_AMBULATORY_CARE_PROVIDER_SITE_OTHER): Payer: Medicare Other

## 2022-06-08 VITALS — BP 133/72 | HR 73 | Temp 98.5°F | Ht 64.25 in | Wt 169.4 lb

## 2022-06-08 DIAGNOSIS — F251 Schizoaffective disorder, depressive type: Secondary | ICD-10-CM

## 2022-06-08 NOTE — Progress Notes (Signed)
Patient in today for due 28 day Invega Sustenna 234 mg/1.5 ml IM injection. Patient presented clean and answered questions. Patient denied any auditory or visual hallucinations, no suicidal or homicidal ideations or plans or intent to harm self or others. Patient reported still doing fine with every 28 day dosage and had no complaints or concerns.    Injection given in the left deltoid area as due. Patient tolerated due injection without any complaint of pain or discomfort and agreed to return in 28 days for next due injection.   Patient to call if any issues or problems after injection today or prior to next appointment.    Buhl Robinson [id number]   S/N [id number]  EXP 10/12/23   LOT [id number]

## 2022-06-08 NOTE — Patient Instructions (Signed)
Patient in today for due 28 day Invega Sustenna 234 mg/1.5 ml IM injection. Patient presented clean and answered questions. Patient denied any auditory or visual hallucinations, no suicidal or homicidal ideations or plans or intent to harm self or others. Patient reported still doing fine with every 28 day dosage and had no complaints or concerns.    Injection given in the left deltoid area as due. Patient tolerated due injection without any complaint of pain or discomfort and agreed to return in 28 days for next due injection.   Patient to call if any issues or problems after injection today or prior to next appointment.    Bowlus Belknap [id number]   S/N [id number]  EXP 10/12/23   LOT [id number]

## 2022-06-18 ENCOUNTER — Other Ambulatory Visit: Payer: Self-pay | Admitting: Psychiatry

## 2022-06-28 ENCOUNTER — Other Ambulatory Visit: Payer: Self-pay | Admitting: Psychiatry

## 2022-06-29 ENCOUNTER — Telehealth: Payer: Self-pay

## 2022-06-29 ENCOUNTER — Other Ambulatory Visit: Payer: Self-pay | Admitting: Psychiatry

## 2022-06-29 MED ORDER — CLONAZEPAM 0.25 MG PO TBDP: 0.2500 mg | ORAL_TABLET | Freq: Every day | ORAL | 0 refills | Status: DC | PRN

## 2022-06-29 NOTE — Telephone Encounter (Signed)
pt left message that she missed read the clonazepam and she been taking a whole pill instead of a 1/2 and now she is out.

## 2022-06-29 NOTE — Telephone Encounter (Signed)
Ordered clonazepam 0.25 mg tab. Advise her to take one tab as needed for anxiety.
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
## 2022-07-07 ENCOUNTER — Other Ambulatory Visit: Payer: Self-pay | Admitting: Psychiatry

## 2022-07-07 ENCOUNTER — Encounter: Payer: Self-pay | Admitting: Psychiatry

## 2022-07-07 ENCOUNTER — Ambulatory Visit (INDEPENDENT_AMBULATORY_CARE_PROVIDER_SITE_OTHER): Payer: 59

## 2022-07-07 ENCOUNTER — Ambulatory Visit (INDEPENDENT_AMBULATORY_CARE_PROVIDER_SITE_OTHER): Payer: 59 | Admitting: Psychiatry

## 2022-07-07 VITALS — BP 118/84 | HR 68 | Temp 97.8°F | Ht 64.25 in | Wt 171.4 lb

## 2022-07-07 VITALS — BP 118/84 | HR 68 | Wt 171.4 lb

## 2022-07-07 DIAGNOSIS — F251 Schizoaffective disorder, depressive type: Secondary | ICD-10-CM

## 2022-07-07 DIAGNOSIS — F411 Generalized anxiety disorder: Secondary | ICD-10-CM | POA: Diagnosis not present

## 2022-07-07 DIAGNOSIS — G4700 Insomnia, unspecified: Secondary | ICD-10-CM | POA: Diagnosis not present

## 2022-07-07 DIAGNOSIS — F32A Depression, unspecified: Secondary | ICD-10-CM

## 2022-07-07 DIAGNOSIS — G2401 Drug induced subacute dyskinesia: Secondary | ICD-10-CM | POA: Diagnosis not present

## 2022-07-07 MED ORDER — PALIPERIDONE PALMITATE ER 234 MG/1.5ML IM SUSY
234.0000 mg | PREFILLED_SYRINGE | INTRAMUSCULAR | Status: DC
  Administered 2022-07-07 – 2022-10-06 (×4): 234 mg via INTRAMUSCULAR

## 2022-07-07 MED ORDER — CLONAZEPAM 0.25 MG PO TBDP: 0.2500 mg | ORAL_TABLET | Freq: Every day | ORAL | 1 refills | Status: DC | PRN

## 2022-07-07 MED ORDER — BUPROPION HCL ER (XL) 300 MG PO TB24: 300.0000 mg | ORAL_TABLET | Freq: Every day | ORAL | 1 refills | Status: DC

## 2022-07-07 MED ORDER — LAMOTRIGINE 200 MG PO TABS: 400.0000 mg | ORAL_TABLET | Freq: Every day | ORAL | 0 refills | Status: DC

## 2022-07-07 MED ORDER — INVEGA SUSTENNA 234 MG/1.5ML IM SUSY: 234.0000 mg | PREFILLED_SYRINGE | INTRAMUSCULAR | 5 refills | Status: DC

## 2022-07-07 NOTE — Telephone Encounter (Signed)
called patient to check to see how she was doing with the medication and she states she doing much better and that things were going well.

## 2022-07-07 NOTE — Patient Instructions (Signed)
Continue Invega sustenna 234 mg IM q28 day  Continue bupropion 300 mg daily  Continue lamotrigine 400 mg daily Continue sertraline 200 mg daily Continue clonazepam 0.25 mg ODT at night  Next appointment: 3/26 at 1:30

## 2022-07-07 NOTE — Progress Notes (Signed)
Patient in today for due 28 day Invega Sustenna 234 mg/1.5 ml IM injection. Patient presented clean and answered questions. Patient denied any auditory or visual hallucinations, no suicidal or homicidal ideations or plans or intent to harm self or others. Patient reported still doing fine with every 28 day dosage and had no complaints or concerns.      Injection given in the right deltoid area as due. Patient tolerated due injection without any complaint of pain or discomfort and agreed to return in 28 days for next due injection.    Patient to call if any issues or problems after injection today or prior to next appointment.      [place] [phone]   S/N [id number]  EXP 11/12/23   LOT [id number]

## 2022-07-07 NOTE — Patient Instructions (Addendum)
Patient in today for due 28 day Invega Sustenna 234 mg/1.5 ml IM injection. Patient presented clean and answered questions. Patient denied any auditory or visual hallucinations, no suicidal or homicidal ideations or plans or intent to harm self or others. Patient reported still doing fine with every 28 day dosage and had no complaints or concerns.      Injection given in the right deltoid area as due. Patient tolerated due injection without any complaint of pain or discomfort and agreed to return in 28 days for next due injection.    Patient to call if any issues or problems after injection today or prior to next appointment.      [place] [phone]   S/N [id number]  EXP 11/12/23   LOT [id number]

## 2022-08-03 ENCOUNTER — Ambulatory Visit
Admission: RE | Admit: 2022-08-03 | Discharge: 2022-08-03 | Disposition: A | Discharge status: Home / Self Care | Payer: 59 | Source: Ambulatory Visit | Attending: Family Medicine | Admitting: Family Medicine

## 2022-08-03 DIAGNOSIS — F251 Schizoaffective disorder, depressive type: Secondary | ICD-10-CM | POA: Diagnosis present

## 2022-08-03 DIAGNOSIS — F259 Schizoaffective disorder, unspecified: Secondary | ICD-10-CM

## 2022-08-08 ENCOUNTER — Ambulatory Visit: Payer: 59

## 2022-08-09 ENCOUNTER — Ambulatory Visit (INDEPENDENT_AMBULATORY_CARE_PROVIDER_SITE_OTHER): Payer: 59

## 2022-08-09 VITALS — BP 130/78 | Temp 98.3°F | Ht 64.25 in | Wt 171.8 lb

## 2022-08-09 DIAGNOSIS — F251 Schizoaffective disorder, depressive type: Secondary | ICD-10-CM

## 2022-08-09 DIAGNOSIS — F32A Depression, unspecified: Secondary | ICD-10-CM | POA: Diagnosis not present

## 2022-08-09 NOTE — Patient Instructions (Signed)
Patient in today for due 28 day Invega Sustenna 234 mg/1.5 ml IM injection. Patient presented clean and answered questions. Patient denied any auditory or visual hallucinations, no suicidal or homicidal ideations or plans or intent to harm self or others. Patient reported still doing fine with every 28 day dosage and had no complaints or concerns.      Injection given in the left deltoid area as due. Patient tolerated due injection without any complaint of pain or discomfort and agreed to return in 28 days for next due injection.    Patient to call if any issues or problems after injection today or prior to next appointment.      [place] [id number]    EXP 12/12/23   LOT [id number]  S/N [id number]

## 2022-08-09 NOTE — Progress Notes (Signed)
Patient in today for due 28 day Invega Sustenna 234 mg/1.5 ml IM injection. Patient presented clean and answered questions. Patient denied any auditory or visual hallucinations, no suicidal or homicidal ideations or plans or intent to harm self or others. Patient reported still doing fine with every 28 day dosage and had no complaints or concerns.      Injection given in the left deltoid area as due. Patient tolerated due injection without any complaint of pain or discomfort and agreed to return in 28 days for next due injection.    Patient to call if any issues or problems after injection today or prior to next appointment.      [place] [id number]    EXP 12/12/23   LOT [id number]  S/N [id number]

## 2022-08-30 ENCOUNTER — Encounter: Payer: Self-pay | Admitting: Internal Medicine

## 2022-08-30 ENCOUNTER — Ambulatory Visit (INDEPENDENT_AMBULATORY_CARE_PROVIDER_SITE_OTHER): Payer: 59 | Admitting: Internal Medicine

## 2022-08-30 VITALS — BP 140/84 | HR 61 | Temp 97.7°F | Ht 64.5 in | Wt 170.8 lb

## 2022-08-30 DIAGNOSIS — G4719 Other hypersomnia: Secondary | ICD-10-CM | POA: Diagnosis not present

## 2022-08-30 NOTE — Progress Notes (Signed)
Name: Monica Curtis MRN: [id number] DOB: 1960/08/04    CHIEF COMPLAINT:  EXCESSIVE DAYTIME SLEEPINESS   HISTORY OF PRESENT ILLNESS: Patient is seen today for problems and issues with sleep related to excessive daytime sleepiness Patient  has been having sleep problems for many years Patient has been having excessive daytime sleepiness for a long time Patient has been having extreme fatigue and tiredness, lack of energy +  very Loud snoring every night + struggling breathe at night and gasps for air + morning headaches  + Nonrefreshing sleep  Patient states she has been diagnosed with sleep apnea very mild 10 years ago Current weight 170 pounds  Works as a Scientist, water quality at [employer] long naps due to excessive daytime sleepiness at the end of the day  Non-smoker Nonalcoholic  Encouraged proper weight management.  Discussed driving precautions and its relationship with hypersomnolence.  Discussed operating dangerous equipment and its relationship with hypersomnolence.  Discussed sleep hygiene, and benefits of a fixed sleep waked time.  The importance of getting eight or more hours of sleep discussed with patient.  Discussed limiting the use of the computer and television before bedtime.  Decrease naps during the day, so night time sleep will become enhanced.  Limit caffeine, and sleep deprivation.  HTN, stroke, and heart failure are potential risk factors.    EPWORTH SLEEP SCORE 8    PAST MEDICAL HISTORY :   has a past medical history of Depression, Diabetes mellitus without complication ([place]), Hyperlipidemia, and Hypertension.  has a past surgical history that includes Abdominal hysterectomy; Colonoscopy; Esophagogastroduodenoscopy; Cesarean section; Colonoscopy with propofol (N/A, 01/16/2018); and Colonoscopy with propofol (N/A, 12/23/2020). Prior to Admission medications   Medication Sig Start Date End Date Taking? Authorizing Provider  buPROPion (WELLBUTRIN XL) 300 MG  24 hr tablet Take 1 tablet (300 mg total) by mouth daily. 07/26/22 09/24/22  Norman Clay, MD  cabergoline (DOSTINEX) 0.5 MG tablet Take 0.5 mg by mouth 3 (three) times a week.    [provider]  cetirizine (ZYRTEC) 10 MG tablet Take 10 mg by mouth daily.    [provider]  clonazePAM (KLONOPIN) 0.25 MG disintegrating tablet Take 1 tablet (0.25 mg total) by mouth daily as needed (anxiety). 07/29/22 09/27/22  Norman Clay, MD  lamoTRIgine (LAMICTAL) 200 MG tablet Take 2 tablets (400 mg total) by mouth daily. 08/17/22 11/15/22  Norman Clay, MD  metroNIDAZOLE (METROCREAM) 0.75 % cream Apply topically. 04/19/22   [provider]  paliperidone (INVEGA SUSTENNA) 234 MG/1.5ML injection Inject 234 mg into the muscle every 28 (twenty-eight) days for 6 doses. 07/07/22 11/25/22  Norman Clay, MD  sertraline (ZOLOFT) 100 MG tablet Take 100 mg by mouth 2 (two) times daily.    [provider]  simvastatin (ZOCOR) 40 MG tablet Take 40 mg by mouth at bedtime. 03/16/22   [provider]   Allergies  Allergen Reactions   Sulfa Antibiotics Hives    FAMILY HISTORY:  family history includes Alcohol abuse in her father; Breast cancer in her paternal grandmother and sister; Depression in her sister; Drug abuse in her father. SOCIAL HISTORY:  reports that she has never smoked. She has never used smokeless tobacco. She reports that she does not currently use alcohol. She reports that she does not use drugs.   Review of Systems:  Gen:  Denies  fever, sweats, chills weight loss  HEENT: Denies blurred vision, double vision, ear pain, eye pain, hearing loss, nose bleeds, sore throat Cardiac:  No dizziness,  chest pain or heaviness, chest tightness,edema, No JVD Resp:   No cough, -sputum production, -shortness of breath,-wheezing, -hemoptysis,  Gi: Denies swallowing difficulty, stomach pain, nausea or vomiting, diarrhea, constipation, bowel incontinence Gu:  Denies bladder  incontinence, burning urine Ext:   Denies Joint pain, stiffness or swelling Skin: Denies  skin rash, easy bruising or bleeding or hives Endoc:  Denies polyuria, polydipsia , polyphagia or weight change Psych:   Denies depression, insomnia or hallucinations  Other:  All other systems negative   ALL OTHER ROS ARE NEGATIVE   BP (!) 140/84 (BP Location: Left Arm, Cuff Size: Normal)   Pulse 61   Temp 97.7 F (36.5 C) (Temporal)   Ht 5' 4.5" (1.638 m)   Wt 170 lb 12.8 oz (77.5 kg)   SpO2 97%   BMI 28.86 kg/m     Physical Examination:   General Appearance: No distress  EYES PERRLA, EOM intact.   NECK Supple, No JVD Pulmonary: normal breath sounds, No wheezing.  CardiovascularNormal S1,S2.  No m/r/g.   Abdomen: Benign, Soft, non-tender. Skin:   warm, no rashes, no ecchymosis  Extremities: normal, no cyanosis, clubbing. Neuro:without focal findings,  speech normal  PSYCHIATRIC: Mood, affect within normal limits.   ALL OTHER ROS ARE NEGATIVE    ASSESSMENT AND PLAN SYNOPSIS  Patient with signs and symptoms of excessive daytime sleepiness with probable underlying diagnosis of obstructive sleep apnea in the setting of deconditioned state   Recommend Sleep Study for definitve diagnosis   -recommend significant weight loss -recommend changing diet  Deconditioned state -Recommend increased daily activity and exercise   MEDICATION ADJUSTMENTS/LABS AND TESTS ORDERED: Recommend Sleep Study    CURRENT MEDICATIONS REVIEWED AT LENGTH WITH PATIENT TODAY   Patient  satisfied with Plan of action and management. All questions answered  Follow up  3 months  Total Time Spent  35 mins   Maretta Bees Patricia Pesa, M.D.  [facility]  Medical Director [place] Director [facility] Cardio-Pulmonary Department

## 2022-08-30 NOTE — Patient Instructions (Signed)
Obtain Home Sleep Test to assess for sleep apnea
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
## 2022-09-06 ENCOUNTER — Encounter: Payer: Self-pay | Admitting: Psychiatry

## 2022-09-06 ENCOUNTER — Ambulatory Visit (INDEPENDENT_AMBULATORY_CARE_PROVIDER_SITE_OTHER): Payer: 59

## 2022-09-06 ENCOUNTER — Ambulatory Visit: Payer: 59 | Admitting: Psychiatry

## 2022-09-06 VITALS — BP 122/84 | HR 80 | Temp 98.7°F | Wt 170.8 lb

## 2022-09-06 VITALS — BP 122/84 | HR 80 | Temp 98.7°F | Ht 64.25 in | Wt 170.8 lb

## 2022-09-06 DIAGNOSIS — G4700 Insomnia, unspecified: Secondary | ICD-10-CM

## 2022-09-06 DIAGNOSIS — F251 Schizoaffective disorder, depressive type: Secondary | ICD-10-CM

## 2022-09-06 DIAGNOSIS — F411 Generalized anxiety disorder: Secondary | ICD-10-CM

## 2022-09-06 DIAGNOSIS — G2401 Drug induced subacute dyskinesia: Secondary | ICD-10-CM

## 2022-09-06 MED ORDER — BUPROPION HCL ER (XL) 150 MG PO TB24: 150.0000 mg | ORAL_TABLET | Freq: Every day | ORAL | 1 refills | Status: DC

## 2022-09-06 MED ORDER — CLONAZEPAM 0.25 MG PO TBDP: 0.2500 mg | ORAL_TABLET | Freq: Two times a day (BID) | ORAL | 1 refills | Status: DC | PRN

## 2022-09-06 MED ORDER — BUPROPION HCL ER (XL) 300 MG PO TB24: 300.0000 mg | ORAL_TABLET | Freq: Every day | ORAL | 0 refills | Status: DC

## 2022-09-06 NOTE — Progress Notes (Signed)
Patient in today for due 28 day Invega Sustenna 234 mg/1.5 ml IM injection. Patient presented clean and answered questions. Patient denied any auditory or visual hallucinations, no suicidal or homicidal ideations or plans or intent to harm self or others. Patient reported still doing fine with every 28 day dosage and had no complaints or concerns.      Injection given in the left deltoid area as due. Patient tolerated due injection without any complaint of pain or discomfort and agreed to return in 28 days for next due injection.    Patient to call if any issues or problems after injection today or prior to next appointment.      [place] [id number]    EXP 01/12/24   LOT [id number]  S/N [id number]

## 2022-09-06 NOTE — Patient Instructions (Signed)
Patient in today for due 28 day Invega Sustenna 234 mg/1.5 ml IM injection. Patient presented clean and answered questions. Patient denied any auditory or visual hallucinations, no suicidal or homicidal ideations or plans or intent to harm self or others. Patient reported still doing fine with every 28 day dosage and had no complaints or concerns.      Injection given in the left deltoid area as due. Patient tolerated due injection without any complaint of pain or discomfort and agreed to return in 28 days for next due injection.    Patient to call if any issues or problems after injection today or prior to next appointment.      [place] [id number]    EXP 01/12/24   LOT [id number]  S/N [id number]

## 2022-09-06 NOTE — Patient Instructions (Signed)
Continue Invega sustenna 234 mg IM q28 day Increase bupropion 450 mg daily  Continue lamotrigine 400 mg daily  Continue sertraline 200 mg daily Continue clonazepam 0.25 mg ODT at night  Next appointment: 5/23 at 1 PM

## 2022-09-29 ENCOUNTER — Other Ambulatory Visit: Payer: Self-pay | Admitting: Psychiatry

## 2022-10-06 ENCOUNTER — Ambulatory Visit (INDEPENDENT_AMBULATORY_CARE_PROVIDER_SITE_OTHER): Payer: 59

## 2022-10-06 VITALS — BP 141/22 | HR 66 | Temp 98.3°F | Ht 64.25 in | Wt 166.0 lb

## 2022-10-06 DIAGNOSIS — F251 Schizoaffective disorder, depressive type: Secondary | ICD-10-CM

## 2022-10-06 NOTE — Patient Instructions (Signed)
Patient in today for due 28 day Invega Sustenna 234 mg/1.5 ml IM injection. Patient presented clean and answered questions. Patient denied any auditory or visual hallucinations, no suicidal or homicidal ideations or plans or intent to harm self or others. Patient reported still doing fine with every 28 day dosage and had no complaints or concerns.      Injection given in the right deltoid area as due. Patient tolerated due injection without any complaint of pain or discomfort and agreed to return in 28 days for next due injection.    Patient to call if any issues or problems after injection today or prior to next appointment.      NDC 16109-604-54   GTIN [id number]    EXP 03/12/24   LOT [id number]  S/N [id number]

## 2022-10-06 NOTE — Progress Notes (Signed)
Patient in today for due 28 day Invega Sustenna 234 mg/1.5 ml IM injection. Patient presented clean and answered questions. Patient denied any auditory or visual hallucinations, no suicidal or homicidal ideations or plans or intent to harm self or others. Patient reported still doing fine with every 28 day dosage and had no complaints or concerns.      Injection given in the right deltoid area as due. Patient tolerated due injection without any complaint of pain or discomfort and agreed to return in 28 days for next due injection.    Patient to call if any issues or problems after injection today or prior to next appointment.      NDC 50458-564-01   GTIN [id number]    EXP 03/12/24   LOT [id number]  S/N [id number] 

## 2022-10-29 ENCOUNTER — Other Ambulatory Visit: Payer: Self-pay | Admitting: Psychiatry
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
## 2022-10-31 ENCOUNTER — Other Ambulatory Visit: Payer: Self-pay | Admitting: Psychiatry

## 2022-11-03 ENCOUNTER — Ambulatory Visit (INDEPENDENT_AMBULATORY_CARE_PROVIDER_SITE_OTHER): Payer: 59 | Admitting: Psychiatry

## 2022-11-03 ENCOUNTER — Encounter: Payer: Self-pay | Admitting: Psychiatry

## 2022-11-03 ENCOUNTER — Ambulatory Visit (INDEPENDENT_AMBULATORY_CARE_PROVIDER_SITE_OTHER): Payer: 59

## 2022-11-03 ENCOUNTER — Ambulatory Visit: Payer: 59

## 2022-11-03 VITALS — BP 144/73 | HR 67 | Temp 98.1°F | Ht 64.25 in | Wt 167.2 lb

## 2022-11-03 DIAGNOSIS — F251 Schizoaffective disorder, depressive type: Secondary | ICD-10-CM | POA: Diagnosis not present

## 2022-11-03 DIAGNOSIS — F411 Generalized anxiety disorder: Secondary | ICD-10-CM

## 2022-11-03 DIAGNOSIS — G2401 Drug induced subacute dyskinesia: Secondary | ICD-10-CM

## 2022-11-03 MED ORDER — INVEGA SUSTENNA 234 MG/1.5ML IM SUSY: 234.0000 mg | PREFILLED_SYRINGE | INTRAMUSCULAR | 5 refills | Status: DC

## 2022-11-03 MED ORDER — PALIPERIDONE PALMITATE ER 234 MG/1.5ML IM SUSY
234.0000 mg | PREFILLED_SYRINGE | INTRAMUSCULAR | Status: DC
  Administered 2022-11-03: 234 mg via INTRAMUSCULAR

## 2022-11-03 MED ORDER — LAMOTRIGINE 200 MG PO TABS: 400.0000 mg | ORAL_TABLET | Freq: Every day | ORAL | 0 refills | Status: DC

## 2022-11-03 MED ORDER — BUPROPION HCL ER (XL) 150 MG PO TB24: 150.0000 mg | ORAL_TABLET | Freq: Every day | ORAL | 0 refills | Status: DC

## 2022-11-03 NOTE — Patient Instructions (Signed)
Patient in today for due 28 day Invega Sustenna 234 mg/1.5 ml IM injection. Patient presented clean and answered questions. Patient denied any auditory or visual hallucinations, no suicidal or homicidal ideations or plans or intent to harm self or others. Patient reported still doing fine with every 28 day dosage and had no complaints or concerns.      Injection given in the left deltoid area as due. Patient tolerated due injection without any complaint of pain or discomfort and agreed to return in 28 days for next due injection.    Patient to call if any issues or problems after injection today or prior to next appointment.      NDC 50458-564-01   GTIN [id number]    EXP 02-12-24   LOT # [id number]  S/N [id number] 

## 2022-11-03 NOTE — Progress Notes (Signed)
Patient in today for due 28 day Invega Sustenna 234 mg/1.5 ml IM injection. Patient presented clean and answered questions. Patient denied any auditory or visual hallucinations, no suicidal or homicidal ideations or plans or intent to harm self or others. Patient reported still doing fine with every 28 day dosage and had no complaints or concerns.      Injection given in the left deltoid area as due. Patient tolerated due injection without any complaint of pain or discomfort and agreed to return in 28 days for next due injection.    Patient to call if any issues or problems after injection today or prior to next appointment.      NDC 16109-604-54   GTIN [id number]    EXP 02-12-24   LOT # [id number]  S/N [id number]

## 2022-11-23 ENCOUNTER — Telehealth: Payer: Self-pay | Admitting: *Deleted

## 2022-11-23 NOTE — Telephone Encounter (Signed)
Called and spoke with patient, advised her that I was going to cancel her appointment for tomorrow 6/13 since she has not had her HST completed.  Advised that [employer] had been trying to reach her without success.  I provided her with the # [phone] to call to get scheduled.  She will call us after she has completed the HST to schedule a f/u.  She verbalized understanding.  Nothing further needed.

## 2022-11-24 ENCOUNTER — Ambulatory Visit: Payer: 59 | Admitting: Internal Medicine

## 2022-12-01 ENCOUNTER — Other Ambulatory Visit: Payer: Self-pay | Admitting: Psychiatry

## 2022-12-01 ENCOUNTER — Telehealth: Payer: Self-pay

## 2022-12-01 ENCOUNTER — Ambulatory Visit (INDEPENDENT_AMBULATORY_CARE_PROVIDER_SITE_OTHER): Payer: 59

## 2022-12-01 VITALS — BP 122/78 | HR 94 | Temp 97.4°F | Ht 64.25 in | Wt 167.8 lb

## 2022-12-01 DIAGNOSIS — F251 Schizoaffective disorder, depressive type: Secondary | ICD-10-CM

## 2022-12-01 DIAGNOSIS — F259 Schizoaffective disorder, unspecified: Secondary | ICD-10-CM

## 2022-12-01 MED ORDER — PALIPERIDONE PALMITATE ER 234 MG/1.5ML IM SUSY: 234.0000 mg | PREFILLED_SYRINGE | INTRAMUSCULAR | Status: DC

## 2022-12-01 MED ORDER — PALIPERIDONE PALMITATE ER 234 MG/1.5ML IM SUSY
234.0000 mg | PREFILLED_SYRINGE | INTRAMUSCULAR | Status: AC
  Administered 2022-12-01 – 2023-04-25 (×6): 234 mg via INTRAMUSCULAR

## 2022-12-01 NOTE — Telephone Encounter (Signed)
pt came into office today to get her injection Patient does reported she has anxiety increase and having increase paranoia especially at work. she states she feels on edge.

## 2022-12-01 NOTE — Patient Instructions (Signed)
Patient in today for due 28 day Invega Sustenna 234 mg/1.5 ml IM injection. Patient presented clean and answered questions. Patient denied any auditory or visual hallucinations, no suicidal or homicidal ideations or plans or intent to harm self or others. Patient does reported she has anxiety increase and having increase paranoia especially at work. She is still doing fine with every 28 day dosage.      Injection given in the left deltoid area  because her right arm is sore. Patient tolerated due injection without any complaint of pain or discomfort and agreed to return in 28 days for next due injection.    Patient to call if any issues or problems after injection today or prior to next appointment.      NDC 50458-564-01   GTIN [id number]    EXP 03-13-24   LOT # [id number]  S/N [id number] 

## 2022-12-01 NOTE — Telephone Encounter (Signed)
Left message to call office back

## 2022-12-01 NOTE — Telephone Encounter (Signed)
Please ask her if she is interested in trying medication for anxiety, such as hydroxyzine. Side effects including drowsiness.

## 2022-12-01 NOTE — Progress Notes (Signed)
Patient in today for due 28 day Invega Sustenna 234 mg/1.5 ml IM injection. Patient presented clean and answered questions. Patient denied any auditory or visual hallucinations, no suicidal or homicidal ideations or plans or intent to harm self or others. Patient does reported she has anxiety increase and having increase paranoia especially at work. She is still doing fine with every 28 day dosage.      Injection given in the left deltoid area  because her right arm is sore. Patient tolerated due injection without any complaint of pain or discomfort and agreed to return in 28 days for next due injection.    Patient to call if any issues or problems after injection today or prior to next appointment.      NDC 52841-324-40   GTIN [id number]    EXP 03-13-24   LOT # [id number]  S/N [id number]

## 2022-12-08 ENCOUNTER — Other Ambulatory Visit: Payer: Self-pay | Admitting: Psychiatry

## 2022-12-08 MED ORDER — CLONAZEPAM 0.25 MG PO TBDP: 0.2500 mg | ORAL_TABLET | Freq: Every evening | ORAL | 1 refills | Status: DC | PRN

## 2022-12-21 ENCOUNTER — Other Ambulatory Visit: Payer: Self-pay | Admitting: Psychiatry

## 2022-12-28 ENCOUNTER — Ambulatory Visit (INDEPENDENT_AMBULATORY_CARE_PROVIDER_SITE_OTHER): Payer: 59

## 2022-12-28 VITALS — BP 118/72 | HR 66 | Temp 98.5°F | Ht 64.25 in | Wt 166.4 lb

## 2022-12-28 DIAGNOSIS — G2401 Drug induced subacute dyskinesia: Secondary | ICD-10-CM

## 2022-12-28 DIAGNOSIS — G2119 Other drug induced secondary parkinsonism: Secondary | ICD-10-CM

## 2022-12-28 DIAGNOSIS — F32A Depression, unspecified: Secondary | ICD-10-CM

## 2022-12-28 DIAGNOSIS — F251 Schizoaffective disorder, depressive type: Secondary | ICD-10-CM

## 2022-12-28 NOTE — Patient Instructions (Signed)
Patient in today for due 28 day Invega Sustenna 234 mg/1.5 ml IM injection. Patient presented clean and answered questions. Patient denied any auditory or visual hallucinations, no suicidal or homicidal ideations or plans or intent to harm self or others. Patient does reported she has anxiety increase and having increase paranoia especially at work. She is still doing fine with every 28 day dosage.      Injection given in the left deltoid. Patient tolerated due injection without any complaint of pain or discomfort and agreed to return in 28 days for next due injection.    Patient to call if any issues or problems after injection today or prior to next appointment.      NDC 08657-846-96   GTIN [id number]    EXP 03-13-24   LOT # [id number]  S/N [id number]

## 2022-12-28 NOTE — Progress Notes (Signed)
Patient in today for due 28 day Invega Sustenna 234 mg/1.5 ml IM injection. Patient presented clean and answered questions. Patient denied any auditory or visual hallucinations, no suicidal or homicidal ideations or plans or intent to harm self or others. Patient does reported she has anxiety increase and having increase paranoia especially at work. She is still doing fine with every 28 day dosage.      Injection given in the left deltoid. Patient tolerated due injection without any complaint of pain or discomfort and agreed to return in 28 days for next due injection.    Patient to call if any issues or problems after injection today or prior to next appointment.      NDC 08657-846-96   GTIN [id number]    EXP 03-13-24   LOT # [id number]  S/N [id number]
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
## 2023-01-11 ENCOUNTER — Other Ambulatory Visit: Payer: Self-pay | Admitting: Psychiatry

## 2023-01-12 ENCOUNTER — Ambulatory Visit (INDEPENDENT_AMBULATORY_CARE_PROVIDER_SITE_OTHER): Payer: 59 | Admitting: Psychiatry

## 2023-01-12 ENCOUNTER — Ambulatory Visit: Payer: 59

## 2023-01-12 ENCOUNTER — Encounter: Payer: Self-pay | Admitting: Psychiatry

## 2023-01-12 VITALS — BP 113/68 | HR 73 | Temp 99.0°F | Ht 64.25 in | Wt 166.2 lb

## 2023-01-12 DIAGNOSIS — G2401 Drug induced subacute dyskinesia: Secondary | ICD-10-CM | POA: Diagnosis not present

## 2023-01-12 DIAGNOSIS — R4189 Other symptoms and signs involving cognitive functions and awareness: Secondary | ICD-10-CM

## 2023-01-12 DIAGNOSIS — Z79899 Other long term (current) drug therapy: Secondary | ICD-10-CM

## 2023-01-12 DIAGNOSIS — F411 Generalized anxiety disorder: Secondary | ICD-10-CM | POA: Diagnosis not present

## 2023-01-12 DIAGNOSIS — F251 Schizoaffective disorder, depressive type: Secondary | ICD-10-CM

## 2023-01-12 MED ORDER — BUPROPION HCL ER (XL) 150 MG PO TB24: 150.0000 mg | ORAL_TABLET | Freq: Every day | ORAL | 0 refills | Status: DC

## 2023-01-12 MED ORDER — LAMOTRIGINE 200 MG PO TABS: 400.0000 mg | ORAL_TABLET | Freq: Every day | ORAL | 0 refills | Status: DC

## 2023-01-12 MED ORDER — BUPROPION HCL ER (XL) 300 MG PO TB24: 300.0000 mg | ORAL_TABLET | Freq: Every day | ORAL | 1 refills | Status: DC

## 2023-01-12 NOTE — Patient Instructions (Signed)
Continue Invega sustenna 234 mg IM q28 day  Continue bupropion 450 mg daily  Continue lamotrigine 400 mg daily  Continue sertraline 200 mg daily Continue clonazepam 0.25 mg ODT at night  Next appointment: 9/17 at 4 pm

## 2023-01-22 ENCOUNTER — Encounter (INDEPENDENT_AMBULATORY_CARE_PROVIDER_SITE_OTHER): Payer: 59

## 2023-01-22 DIAGNOSIS — G4733 Obstructive sleep apnea (adult) (pediatric): Secondary | ICD-10-CM | POA: Diagnosis not present

## 2023-01-22 DIAGNOSIS — G4719 Other hypersomnia: Secondary | ICD-10-CM

## 2023-01-31 ENCOUNTER — Telehealth: Payer: Self-pay

## 2023-01-31 DIAGNOSIS — G4733 Obstructive sleep apnea (adult) (pediatric): Secondary | ICD-10-CM

## 2023-01-31 NOTE — Telephone Encounter (Signed)
Pt returning missed call. 

## 2023-01-31 NOTE — Telephone Encounter (Signed)
Patient is aware of results and voiced her understanding.  She agrees with cpap.  Order has been placed.  She will keep scheduled visit for 03/17/2023 Nothing further needed.

## 2023-01-31 NOTE — Telephone Encounter (Signed)
Lm x1 for patient.  

## 2023-01-31 NOTE — Telephone Encounter (Signed)
-----   Message from [place] sent at 01/31/2023  8:52 AM EDT ----- HST shows DIAGNOSIS OF  OBSTRUCTIVE SLEEP APNEA  OPTIONS I recommend a trial of auto CPAP 5 to 10 cm H2O with heated humidity and mask of choice ----- Message ----- From: Antionette Fairy Sent: 01/23/2023   9:47 AM EDT To: Erin Fulling, MD

## 2023-02-02 ENCOUNTER — Ambulatory Visit (INDEPENDENT_AMBULATORY_CARE_PROVIDER_SITE_OTHER): Payer: 59

## 2023-02-02 VITALS — BP 155/80 | HR 71 | Temp 98.5°F | Ht 64.25 in | Wt 166.2 lb

## 2023-02-02 DIAGNOSIS — F251 Schizoaffective disorder, depressive type: Secondary | ICD-10-CM

## 2023-02-02 NOTE — Patient Instructions (Addendum)
Patient in today for due 28 day Invega Sustenna 234 mg/1.5 ml IM injection. Patient presented clean and answered questions. Patient denied any auditory or visual hallucinations, no suicidal or homicidal ideations or plans or intent to harm self or others. Patient does reported she has anxiety increase and having increase paranoia especially at work. She is still doing fine with every 28 day dosage.      Injection given in the right deltoid. Patient tolerated due injection without any complaint of pain or discomfort and agreed to return in 28 days for next due injection.    Patient to call if any issues or problems after injection today or prior to next appointment.      NDC 16109-604-54   GTIN [id number]    EXP 09-12-23   LOT # [id number]  S/N [id number]

## 2023-02-02 NOTE — Progress Notes (Signed)
Patient in today for due 28 day Invega Sustenna 234 mg/1.5 ml IM injection. Patient presented clean and answered questions. Patient denied any auditory or visual hallucinations, no suicidal or homicidal ideations or plans or intent to harm self or others. Patient does reported she has anxiety increase and having increase paranoia especially at work. She is still doing fine with every 28 day dosage.      Injection given in the right deltoid. Patient tolerated due injection without any complaint of pain or discomfort and agreed to return in 28 days for next due injection.    Patient to call if any issues or problems after injection today or prior to next appointment.      NDC 16109-604-54   GTIN [id number]    EXP 09-12-23   LOT # [id number]  S/N [id number]

## 2023-02-13 ENCOUNTER — Other Ambulatory Visit: Payer: Self-pay | Admitting: Psychiatry
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
## 2023-02-28 ENCOUNTER — Ambulatory Visit (INDEPENDENT_AMBULATORY_CARE_PROVIDER_SITE_OTHER): Payer: 59 | Admitting: Psychiatry

## 2023-02-28 ENCOUNTER — Ambulatory Visit (INDEPENDENT_AMBULATORY_CARE_PROVIDER_SITE_OTHER): Payer: 59

## 2023-02-28 ENCOUNTER — Encounter: Payer: Self-pay | Admitting: Psychiatry

## 2023-02-28 VITALS — BP 153/79 | HR 65 | Temp 98.0°F | Ht 64.25 in | Wt 167.4 lb

## 2023-02-28 DIAGNOSIS — R4189 Other symptoms and signs involving cognitive functions and awareness: Secondary | ICD-10-CM | POA: Diagnosis not present

## 2023-02-28 DIAGNOSIS — G2401 Drug induced subacute dyskinesia: Secondary | ICD-10-CM

## 2023-02-28 DIAGNOSIS — F251 Schizoaffective disorder, depressive type: Secondary | ICD-10-CM

## 2023-02-28 DIAGNOSIS — F411 Generalized anxiety disorder: Secondary | ICD-10-CM

## 2023-02-28 DIAGNOSIS — Z79899 Other long term (current) drug therapy: Secondary | ICD-10-CM

## 2023-02-28 NOTE — Patient Instructions (Addendum)
Continue Invega sustenna 234 mg IM Continue bupropion 450 mg daily  Continue lamotrigine 400 mg daily  Start lexapro 10 mg daily for one week, then 20 mg daily  Decrease sertraline 150 mg daily for one week, then 100 mg daily for one week, then 50 mg daily for one week, then discontinue Continue clonazepam 0.25 mg ODT at night  Obtain lab (folate, vitamin B12) at [facility] Next appointment: 11/5 at 4:30

## 2023-02-28 NOTE — Patient Instructions (Addendum)
Patient in today for due 28 day Invega Sustenna 234 mg/1.5 ml IM injection. Patient presented clean and answered questions. Patient denied any auditory or visual hallucinations, no suicidal or homicidal ideations or plans or intent to harm self or others. Patient does reported she has anxiety increase and having increase paranoia especially at work. She is still doing fine with every 28 day dosage.      Injection given in the left deltoid. Patient tolerated due injection without any complaint of pain or discomfort and agreed to return in 28 days for next due injection.    Patient to call if any issues or problems after injection today or prior to next appointment.      NDC 56213-086-57   GTIN [id number]   EXP 06-12-2024  LOT # [id number]  S/N [id number]

## 2023-02-28 NOTE — Progress Notes (Signed)
Patient in today for due 28 day Invega Sustenna 234 mg/1.5 ml IM injection. Patient presented clean and answered questions. Patient denied any auditory or visual hallucinations, no suicidal or homicidal ideations or plans or intent to harm self or others. Patient does reported she has anxiety increase and having increase paranoia especially at work. She is still doing fine with every 28 day dosage.      Injection given in the left deltoid. Patient tolerated due injection without any complaint of pain or discomfort and agreed to return in 28 days for next due injection.    Patient to call if any issues or problems after injection today or prior to next appointment.      NDC 56213-086-57   GTIN [id number]   EXP 06-12-2024  LOT # [id number]  S/N [id number]

## 2023-03-03 ENCOUNTER — Other Ambulatory Visit: Payer: Self-pay | Admitting: Psychiatry

## 2023-03-03 ENCOUNTER — Telehealth: Payer: Self-pay

## 2023-03-03 MED ORDER — ESCITALOPRAM OXALATE 20 MG PO TABS: 20.0000 mg | ORAL_TABLET | Freq: Every day | ORAL | 1 refills | Status: DC

## 2023-03-03 MED ORDER — ESCITALOPRAM OXALATE 10 MG PO TABS: 10.0000 mg | ORAL_TABLET | Freq: Every day | ORAL | 0 refills | Status: DC

## 2023-03-03 NOTE — Telephone Encounter (Signed)
pt states that the pharmacy did not get the new rx that you was going to put her on.

## 2023-03-03 NOTE — Telephone Encounter (Signed)
Sorry, the order has been sent.

## 2023-03-10 ENCOUNTER — Telehealth: Payer: Self-pay | Admitting: Internal Medicine

## 2023-03-10 NOTE — Telephone Encounter (Signed)
We have now received the CMN and it has been placed in Dr. Clovis Fredrickson look at folder

## 2023-03-10 NOTE — Telephone Encounter (Signed)
[facility] checking on fax sent 03/02/2023. Fax was a CMN for CPAP machine supplies. [facility] phone number is [phone].

## 2023-03-10 NOTE — Telephone Encounter (Signed)
I have checked Dr. Clovis Fredrickson to be looked at folder along with everything he has signed for this week. We don't have a CMN for this patient.  I called [employer] and spoke with Arville Lime and she is going to refax the CMN to our office

## 2023-03-16 ENCOUNTER — Telehealth: Payer: Self-pay

## 2023-03-16 NOTE — Telephone Encounter (Signed)
Per [facility] patient has not been setup on cpap 31-90d. Spoke to patient and rescheduled appt to 04/25/2023 at 11:00. Nothing further needed.

## 2023-03-16 NOTE — Telephone Encounter (Signed)
Dr. Belia Heman signed the CMN on 03/15/23 and it was faxed on 03/15/23

## 2023-03-17 ENCOUNTER — Other Ambulatory Visit: Payer: Self-pay | Admitting: Psychiatry

## 2023-03-17 ENCOUNTER — Ambulatory Visit: Payer: 59 | Admitting: Adult Health

## 2023-03-27 ENCOUNTER — Other Ambulatory Visit: Payer: Self-pay | Admitting: Psychiatry

## 2023-03-28 ENCOUNTER — Ambulatory Visit (INDEPENDENT_AMBULATORY_CARE_PROVIDER_SITE_OTHER): Payer: 59

## 2023-03-28 VITALS — BP 135/71 | HR 69 | Temp 98.2°F | Ht 64.25 in | Wt 166.4 lb

## 2023-03-28 DIAGNOSIS — F251 Schizoaffective disorder, depressive type: Secondary | ICD-10-CM

## 2023-03-28 NOTE — Progress Notes (Signed)
Patient in today for due 28 day Invega Sustenna 234 mg/1.5 ml IM injection. Patient presented clean and answered questions. Patient denied any auditory or visual hallucinations, no suicidal or homicidal ideations or plans or intent to harm self or others. Patient does reported she has anxiety increase and having increase paranoia especially at work. She is still doing fine with every 28 day dosage.      Injection given in the right deltoid. Patient tolerated due injection without any complaint of pain or discomfort and agreed to return in 28 days for next due injection.    Patient to call if any issues or problems after injection today or prior to next appointment.      NDC 26948-546-27   GTIN [id number]   EXP 06-12-2025  LOT # [id number]  S/N [id number]

## 2023-03-28 NOTE — Patient Instructions (Signed)
Patient in today for due 28 day Invega Sustenna 234 mg/1.5 ml IM injection. Patient presented clean and answered questions. Patient denied any auditory or visual hallucinations, no suicidal or homicidal ideations or plans or intent to harm self or others. Patient does reported she has anxiety increase and having increase paranoia especially at work. She is still doing fine with every 28 day dosage.      Injection given in the right deltoid. Patient tolerated due injection without any complaint of pain or discomfort and agreed to return in 28 days for next due injection.    Patient to call if any issues or problems after injection today or prior to next appointment.      NDC 26948-546-27   GTIN [id number]   EXP 06-12-2025  LOT # [id number]  S/N [id number]

## 2023-04-18 ENCOUNTER — Ambulatory Visit: Payer: 59 | Admitting: Psychiatry
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
## 2023-04-20 ENCOUNTER — Other Ambulatory Visit: Payer: Self-pay | Admitting: Psychiatry

## 2023-04-21 ENCOUNTER — Other Ambulatory Visit: Payer: Self-pay | Admitting: Psychiatry

## 2023-04-25 ENCOUNTER — Ambulatory Visit (INDEPENDENT_AMBULATORY_CARE_PROVIDER_SITE_OTHER): Payer: 59 | Admitting: Internal Medicine

## 2023-04-25 ENCOUNTER — Encounter: Payer: Self-pay | Admitting: Internal Medicine

## 2023-04-25 ENCOUNTER — Encounter: Payer: Self-pay | Admitting: Psychiatry

## 2023-04-25 ENCOUNTER — Ambulatory Visit (INDEPENDENT_AMBULATORY_CARE_PROVIDER_SITE_OTHER): Payer: 59 | Admitting: Psychiatry

## 2023-04-25 ENCOUNTER — Ambulatory Visit (INDEPENDENT_AMBULATORY_CARE_PROVIDER_SITE_OTHER): Payer: 59

## 2023-04-25 VITALS — BP 156/76 | HR 78 | Temp 98.0°F | Ht 64.25 in | Wt 168.0 lb

## 2023-04-25 VITALS — BP 112/74 | HR 60 | Temp 97.8°F | Ht 64.25 in | Wt 168.4 lb

## 2023-04-25 DIAGNOSIS — G2401 Drug induced subacute dyskinesia: Secondary | ICD-10-CM

## 2023-04-25 DIAGNOSIS — F411 Generalized anxiety disorder: Secondary | ICD-10-CM | POA: Diagnosis not present

## 2023-04-25 DIAGNOSIS — F251 Schizoaffective disorder, depressive type: Secondary | ICD-10-CM

## 2023-04-25 DIAGNOSIS — G4733 Obstructive sleep apnea (adult) (pediatric): Secondary | ICD-10-CM | POA: Diagnosis not present

## 2023-04-25 DIAGNOSIS — R4189 Other symptoms and signs involving cognitive functions and awareness: Secondary | ICD-10-CM

## 2023-04-25 MED ORDER — PALIPERIDONE PALMITATE ER 234 MG/1.5ML IM SUSY
234.0000 mg | PREFILLED_SYRINGE | INTRAMUSCULAR | Status: DC
  Administered 2023-05-24 – 2023-09-14 (×5): 234 mg via INTRAMUSCULAR

## 2023-04-25 MED ORDER — BUPROPION HCL ER (XL) 150 MG PO TB24: 150.0000 mg | ORAL_TABLET | Freq: Every day | ORAL | 0 refills | Status: DC

## 2023-04-25 MED ORDER — CLONAZEPAM 0.25 MG PO TBDP: 0.2500 mg | ORAL_TABLET | Freq: Every evening | ORAL | 1 refills | Status: DC | PRN

## 2023-04-25 MED ORDER — LAMOTRIGINE 200 MG PO TABS: 400.0000 mg | ORAL_TABLET | Freq: Every day | ORAL | 0 refills | Status: DC

## 2023-04-25 NOTE — Patient Instructions (Signed)
Patient in today for due 28 day Invega Sustenna 234 mg/1.5 ml IM injection. Patient presented clean and answered questions. Patient denied any auditory or visual hallucinations, no suicidal or homicidal ideations or plans or intent to harm self or others. Patient does reported she has anxiety increase and having increase paranoia especially at work. She is still doing fine with every 28 day dosage.      Injection given in the left deltoid. Patient tolerated due injection without any complaint of pain or discomfort and agreed to return in 28 days for next due injection.    Patient to call if any issues or problems after injection today or prior to next appointment.      NDC 73220-254-27   GTIN [id number]   EXP 07/13/2024  LOT # [id number]  S/N [id number]

## 2023-04-25 NOTE — Addendum Note (Signed)
Addended by: Bonney Leitz on: 04/25/2023 11:45 AM   Modules accepted: Orders

## 2023-04-25 NOTE — Patient Instructions (Signed)
Continue Invega Sustenna 234 mg IM  Continue bupropion 450 mg daily  Continue lamotrigine 400 mg daily  Continue lexapro 20 mg daily  Continue clonazepam 0.25 mg ODT at night  Obtain lab (folate, vitamin B12, prolactin) at [facility] Obtain head CT Next appointment: 1/16 at 4 pm

## 2023-04-25 NOTE — Progress Notes (Signed)
Name: Monica Curtis MRN: [id number] DOB: 24-May-1961    CHIEF COMPLAINT:  Follow-up assessment for sleep apnea   HISTORY OF PRESENT ILLNESS: Patient is here for follow-up sleep apnea assessment HST showed severe sleep apnea AHI of 40 Findings reviewed in detail with patient Patient is have a hard time with the mask Has tried several masks I have recommended mask fitting desensitization referral in [place]   Previous and current situation Works as a Conservation officer, nature at [employer] long naps due to excessive daytime sleepiness at the end of the day Non-smoker Nonalcoholic    Encouraged proper weight management.  Important to get eight or more hours of sleep  Limiting the use of the computer and television before bedtime.  Decrease naps during the day, so night time sleep will become enhanced.  Limit caffeine, and sleep deprivation.  HTN, stroke, uncontrolled diabetes and heart failure are potential risk factors.  Risk of untreated sleep apnea including cardiac arrhthymias, stroke, DM, pulm HTN.    No exacerbation at this time No evidence of heart failure at this time No evidence or signs of infection at this time No respiratory distress No fevers, chills, nausea, vomiting, diarrhea No evidence of lower extremity edema No evidence hemoptysis    PAST MEDICAL HISTORY :   has a past medical history of Depression, Diabetes mellitus without complication (HCC), Hyperlipidemia, and Hypertension.  has a past surgical history that includes Abdominal hysterectomy; Colonoscopy; Esophagogastroduodenoscopy; Cesarean section; Colonoscopy with propofol (N/A, 01/16/2018); and Colonoscopy with propofol (N/A, 12/23/2020). Prior to Admission medications   Medication Sig Start Date End Date Taking? Authorizing Provider  buPROPion (WELLBUTRIN XL) 300 MG 24 hr tablet Take 1 tablet (300 mg total) by mouth daily. 07/26/22 09/24/22  Neysa Hotter, MD  cabergoline (DOSTINEX) 0.5 MG tablet Take 0.5 mg  by mouth 3 (three) times a week.    [provider]  cetirizine (ZYRTEC) 10 MG tablet Take 10 mg by mouth daily.    [provider]  clonazePAM (KLONOPIN) 0.25 MG disintegrating tablet Take 1 tablet (0.25 mg total) by mouth daily as needed (anxiety). 07/29/22 09/27/22  Neysa Hotter, MD  lamoTRIgine (LAMICTAL) 200 MG tablet Take 2 tablets (400 mg total) by mouth daily. 08/17/22 11/15/22  Neysa Hotter, MD  metroNIDAZOLE (METROCREAM) 0.75 % cream Apply topically. 04/19/22   [provider]  paliperidone (INVEGA SUSTENNA) 234 MG/1.5ML injection Inject 234 mg into the muscle every 28 (twenty-eight) days for 6 doses. 07/07/22 11/25/22  Neysa Hotter, MD  sertraline (ZOLOFT) 100 MG tablet Take 100 mg by mouth 2 (two) times daily.    [provider]  simvastatin (ZOCOR) 40 MG tablet Take 40 mg by mouth at bedtime. 03/16/22   [provider]   Allergies  Allergen Reactions   Sulfa Antibiotics Hives    FAMILY HISTORY:  family history includes Alcohol abuse in her father; Breast cancer in her paternal grandmother and sister; Depression in her sister; Drug abuse in her father. SOCIAL HISTORY:  reports that she has never smoked. She has never used smokeless tobacco. She reports that she does not currently use alcohol. She reports that she does not use drugs.  BP 112/74 (BP Location: Right Arm, Cuff Size: Normal)   Pulse 60   Temp 97.8 F (36.6 C)   Ht 5' 4.25" (1.632 m)   Wt 168 lb 6.4 oz (76.4 kg)   SpO2 97%   BMI 28.68 kg/m      Review of Systems: Gen:  Denies  fever, sweats, chills weight loss  HEENT: Denies blurred vision, double vision, ear pain, eye pain, hearing loss, nose bleeds, sore throat Cardiac:  No dizziness, chest pain or heaviness, chest tightness,edema, No JVD Resp:   No cough, -sputum production, -shortness of breath,-wheezing, -hemoptysis,  Other:  All other systems negative   Physical Examination:   General Appearance: No distress   EYES PERRLA, EOM intact.   NECK Supple, No JVD Pulmonary: normal breath sounds, No wheezing.  CardiovascularNormal S1,S2.  No m/r/g.   Abdomen: Benign, Soft, non-tender. Neurology UE/LE 5/5 strength, no focal deficits Ext pulses intact, cap refill intact ALL OTHER ROS ARE NEGATIVE  ASSESSMENT AND PLAN SYNOPSIS  62 year old pleasant white female seen today for follow-up assessment for severe sleep apnea with AHI at 40  Severe OSA Compliance report reviewed in detail with patient Excellent compliance report at 100% AHI reduced to 2.2 Auto CPAP 5-10 Patient does have intermittent leakage Mask fitting assessment referral made  Obesity -recommend significant weight loss -recommend changing diet  Deconditioned state -Recommend increased daily activity and exercise    MEDICATION ADJUSTMENTS/LABS AND TESTS ORDERED: Continue your CPAP as prescribed WE Will place referral to mask fitting assessment in [place] Avoid secondhand smoke Avoid SICK contacts Recommend  Masking  when appropriate Recommend Keep up-to-date with vaccinations    CURRENT MEDICATIONS REVIEWED AT LENGTH WITH PATIENT TODAY   Patient  satisfied with Plan of action and management. All questions answered  Follow up  3 months  Total Time Spent  32 mins   Wallis Bamberg Santiago Glad, M.D.  [facility]  Medical Director [facility] Medical Director [facility] Cardio-Pulmonary Department

## 2023-04-25 NOTE — Progress Notes (Signed)
Patient in today for due 28 day Invega Sustenna 234 mg/1.5 ml IM injection. Patient presented clean and answered questions. Patient denied any auditory or visual hallucinations, no suicidal or homicidal ideations or plans or intent to harm self or others. Patient does reported she has anxiety increase and having increase paranoia especially at work. She is still doing fine with every 28 day dosage.      Injection given in the left deltoid. Patient tolerated due injection without any complaint of pain or discomfort and agreed to return in 28 days for next due injection.    Patient to call if any issues or problems after injection today or prior to next appointment.      NDC 73220-254-27   GTIN [id number]   EXP 07/13/2024  LOT # [id number]  S/N [id number]

## 2023-04-25 NOTE — Patient Instructions (Signed)
Excellent job A+  Continue your CPAP as prescribed  WE Will place referral to mask fitting assessment in [place]  Avoid secondhand smoke Avoid SICK contacts Recommend  Masking  when appropriate Recommend Keep up-to-date with vaccinations

## 2023-05-08 ENCOUNTER — Ambulatory Visit (HOSPITAL_BASED_OUTPATIENT_CLINIC_OR_DEPARTMENT_OTHER): Payer: 59 | Attending: Internal Medicine

## 2023-05-08 DIAGNOSIS — G4733 Obstructive sleep apnea (adult) (pediatric): Secondary | ICD-10-CM

## 2023-05-24 ENCOUNTER — Ambulatory Visit (INDEPENDENT_AMBULATORY_CARE_PROVIDER_SITE_OTHER): Payer: 59

## 2023-05-24 VITALS — BP 145/81 | HR 63 | Temp 98.9°F | Ht 64.0 in | Wt 167.4 lb

## 2023-05-24 DIAGNOSIS — F251 Schizoaffective disorder, depressive type: Secondary | ICD-10-CM

## 2023-05-24 NOTE — Patient Instructions (Signed)
Patient in today for due 28 day Invega Sustenna 234 mg/1.5 ml IM injection. Patient presented clean and answered questions. Patient denied any auditory or visual hallucinations, no suicidal or homicidal ideations or plans or intent to harm self or others. Patient does reported she has anxiety increase and having increase paranoia especially at work. She is still doing fine with every 28 day dosage.      Injection given in the right deltoid. Patient tolerated due injection without any complaint of pain or discomfort and agreed to return in 28 days for next due injection.    Patient to call if any issues or problems after injection today or prior to next appointment.      NDC 62952-841-32   GTIN [id number]   EXP 08-10-2024 LOT # [id number] S/N [id number]

## 2023-05-24 NOTE — Progress Notes (Unsigned)
Patient in today for due 28 day Invega Sustenna 234 mg/1.5 ml IM injection. Patient presented clean and answered questions. Patient denied any auditory or visual hallucinations, no suicidal or homicidal ideations or plans or intent to harm self or others. Patient does reported she has anxiety increase and having increase paranoia especially at work. She is still doing fine with every 28 day dosage.      Injection given in the right deltoid. Patient tolerated due injection without any complaint of pain or discomfort and agreed to return in 28 days for next due injection.    Patient to call if any issues or problems after injection today or prior to next appointment.      NDC 62952-841-32   GTIN [id number]   EXP 08-10-2024 LOT # [id number] S/N [id number]

## 2023-06-21 ENCOUNTER — Telehealth: Payer: Self-pay

## 2023-06-21 ENCOUNTER — Ambulatory Visit: Payer: 59

## 2023-06-21 VITALS — BP 132/78 | HR 79 | Temp 98.7°F | Ht 64.0 in | Wt 167.9 lb

## 2023-06-21 DIAGNOSIS — F251 Schizoaffective disorder, depressive type: Secondary | ICD-10-CM

## 2023-06-21 DIAGNOSIS — F32A Depression, unspecified: Secondary | ICD-10-CM

## 2023-06-21 NOTE — Telephone Encounter (Signed)
 CT set up for 07-05-23 @ 1:30 arrive at 1:15. pt was given the infomation while in the office.

## 2023-06-21 NOTE — Progress Notes (Signed)
 Patient in today for due 28 day Invega  Sustenna 234 mg/1.5 ml IM injection. Patient presented clean and answered questions. Patient denied any auditory or visual hallucinations, no suicidal or homicidal ideations or plans or intent to harm self or others. Patient states she is having memory issues (pt did not know who I was) and that people at work or some times even people she talk to gets frustrated with her because they have to keep telling her over and over again.She is still doing fine with every 28 day dosage.     Injection given in the left deltoid. Patient tolerated due injection without any complaint of pain or discomfort and agreed to return in 28 days for next due injection.    Patient to call if any issues or problems after injection today or prior to next appointment.      NDC 49541-435-98   GTIN [id number]   EXP 07-14-2024 LOT # [id number] S/N [id number].  Pt also stated that dr. Hisada had order a test for her and that no one has called her back.  While pt was in the office I called CT dept and set up the CT for 07-04-22 @ 1:30 with arrive time of 1:15. Pt confirmed that date was ok.

## 2023-06-21 NOTE — Telephone Encounter (Signed)
 pt came into the office for injection she stated that no one called her about having a ct done.

## 2023-06-21 NOTE — Patient Instructions (Signed)
 Patient in today for due 28 day Invega  Sustenna 234 mg/1.5 ml IM injection. Patient presented clean and answered questions. Patient denied any auditory or visual hallucinations, no suicidal or homicidal ideations or plans or intent to harm self or others. Patient states she is having memory issues (pt did not know who I was) and that people at work or some times even people she talk to gets frustrated with her because they have to keep telling her over and over again.She is still doing fine with every 28 day dosage.     Injection given in the left deltoid. Patient tolerated due injection without any complaint of pain or discomfort and agreed to return in 28 days for next due injection.    Patient to call if any issues or problems after injection today or prior to next appointment.      NDC 49541-435-98   GTIN [id number]   EXP 07-14-2024 LOT # [id number] S/N [id number].  Pt also stated that dr. Hisada had order a test for her and that no one has called her back.  While pt was in the office I called CT dept and set up the CT for 07-04-22 @ 1:30 with arrive time of 1:15. Pt confirmed that date was ok.
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
## 2023-06-29 ENCOUNTER — Encounter: Payer: Self-pay | Admitting: Psychiatry

## 2023-06-29 ENCOUNTER — Ambulatory Visit (INDEPENDENT_AMBULATORY_CARE_PROVIDER_SITE_OTHER): Payer: 59 | Admitting: Psychiatry

## 2023-06-29 VITALS — BP 165/73 | HR 64 | Temp 97.7°F | Ht 64.0 in | Wt 169.6 lb

## 2023-06-29 DIAGNOSIS — F411 Generalized anxiety disorder: Secondary | ICD-10-CM

## 2023-06-29 DIAGNOSIS — G2401 Drug induced subacute dyskinesia: Secondary | ICD-10-CM | POA: Diagnosis not present

## 2023-06-29 DIAGNOSIS — E559 Vitamin D deficiency, unspecified: Secondary | ICD-10-CM

## 2023-06-29 DIAGNOSIS — F251 Schizoaffective disorder, depressive type: Secondary | ICD-10-CM

## 2023-06-29 DIAGNOSIS — Z79899 Other long term (current) drug therapy: Secondary | ICD-10-CM

## 2023-06-29 DIAGNOSIS — R4189 Other symptoms and signs involving cognitive functions and awareness: Secondary | ICD-10-CM

## 2023-06-29 DIAGNOSIS — R5383 Other fatigue: Secondary | ICD-10-CM

## 2023-06-29 MED ORDER — CLONAZEPAM 0.25 MG PO TBDP: 0.2500 mg | ORAL_TABLET | Freq: Every evening | ORAL | 1 refills | Status: DC | PRN

## 2023-06-29 MED ORDER — BUPROPION HCL ER (XL) 150 MG PO TB24: 150.0000 mg | ORAL_TABLET | Freq: Every day | ORAL | 1 refills | Status: DC

## 2023-06-29 MED ORDER — ESCITALOPRAM OXALATE 20 MG PO TABS: 20.0000 mg | ORAL_TABLET | Freq: Every day | ORAL | 1 refills | Status: DC

## 2023-06-29 NOTE — Patient Instructions (Addendum)
Continue Invega Sustenna 234 mg IM q28 day Continue bupropion 450 mg daily  Continue lamotrigine 400 mg daily Continue lexapro 20 mg daily  Continue clonazepam 0.25 mg ODT at night  Obtain lab (folate, vitamin B12, prolactin, vitamin D, iron panels) at [facility] Obtain head CT Next appointment: 3/18 at 4:30

## 2023-07-05 ENCOUNTER — Ambulatory Visit
Admission: RE | Admit: 2023-07-05 | Discharge: 2023-07-05 | Disposition: A | Discharge status: Home / Self Care | Payer: 59 | Source: Ambulatory Visit | Attending: Psychiatry | Admitting: Psychiatry

## 2023-07-05 DIAGNOSIS — R413 Other amnesia: Secondary | ICD-10-CM | POA: Insufficient documentation

## 2023-07-05 DIAGNOSIS — R4189 Other symptoms and signs involving cognitive functions and awareness: Secondary | ICD-10-CM | POA: Diagnosis present

## 2023-07-17 ENCOUNTER — Telehealth: Payer: Self-pay

## 2023-07-17 NOTE — Progress Notes (Signed)
Please inform the patient that her head CT scan did not show any obvious findings to explain her memory loss. Please remind her to obtain labs as we discussed at her last visit.

## 2023-07-17 NOTE — Telephone Encounter (Signed)
-----   Message from Odell sent at 07/17/2023  8:22 AM EST ----- Please inform the patient that her head CT scan did not show any obvious findings to explain her memory loss. Please remind her to obtain labs as we discussed at her last visit.

## 2023-07-17 NOTE — Telephone Encounter (Signed)
 left message asking pt to call office back.

## 2023-07-19 ENCOUNTER — Ambulatory Visit: Payer: No Typology Code available for payment source

## 2023-07-19 VITALS — BP 146/75 | HR 57 | Temp 98.1°F | Ht 64.0 in | Wt 168.8 lb

## 2023-07-19 DIAGNOSIS — F259 Schizoaffective disorder, unspecified: Secondary | ICD-10-CM | POA: Diagnosis not present

## 2023-07-19 DIAGNOSIS — F411 Generalized anxiety disorder: Secondary | ICD-10-CM

## 2023-07-19 DIAGNOSIS — F32A Depression, unspecified: Secondary | ICD-10-CM

## 2023-07-19 NOTE — Patient Instructions (Signed)
 Patient in today for due 28 day Invega Sustenna 234 mg/1.5 ml IM injection. Patient presented clean and answered questions. Patient denied any auditory or visual hallucinations, no suicidal or homicidal ideations or plans or intent to harm self or others. Patient does reported she has anxiety increase and having increase paranoia especially at work. She is still doing fine with every 28 day dosage.      Injection given in the right deltoid. Patient tolerated due injection without any complaint of pain or discomfort and agreed to return in 28 days for next due injection.    Patient to call if any issues or problems after injection today or prior to next appointment.      NDC 46962-952-84   GTIN [id number]  EXP 08-10-2024 LOT # [id number] S/N [id number]

## 2023-07-19 NOTE — Progress Notes (Signed)
 Patient in today for due 28 day Invega  Sustenna 234 mg/1.5 ml IM injection. Patient presented clean and answered questions. Patient denied any auditory or visual hallucinations, no suicidal or homicidal ideations or plans or intent to harm self or others. Patient does reported she has anxiety increase and having increase paranoia especially at work. She is still doing fine with every 28 day dosage.      Injection given in the right deltoid. Patient tolerated due injection without any complaint of pain or discomfort and agreed to return in 28 days for next due injection.    Patient to call if any issues or problems after injection today or prior to next appointment.      NDC 49541-435-98   GTIN [id number]  EXP 08-10-2024 LOT # [id number] S/N [id number]

## 2023-07-20 ENCOUNTER — Telehealth: Payer: Self-pay

## 2023-07-20 NOTE — Telephone Encounter (Signed)
-----   Message from Odell sent at 07/17/2023  8:22 AM EST ----- Please inform the patient that her head CT scan did not show any obvious findings to explain her memory loss. Please remind her to obtain labs as we discussed at her last visit.

## 2023-07-20 NOTE — Telephone Encounter (Signed)
 mailed copy of labwork orders to the patient.

## 2023-07-20 NOTE — Telephone Encounter (Signed)
 pt was given the results and also asked if she had her labs done yet. pt stated no. I told her that she should be able to go to any [facility] and have them done. i told her that i would also mail a copy of the labwork order to her also.

## 2023-07-26 ENCOUNTER — Ambulatory Visit (INDEPENDENT_AMBULATORY_CARE_PROVIDER_SITE_OTHER): Payer: 59 | Admitting: Internal Medicine

## 2023-07-26 ENCOUNTER — Encounter: Payer: Self-pay | Admitting: Internal Medicine

## 2023-07-26 VITALS — BP 138/76 | HR 56 | Temp 97.6°F | Ht 64.0 in | Wt 169.0 lb

## 2023-07-26 DIAGNOSIS — G4733 Obstructive sleep apnea (adult) (pediatric): Secondary | ICD-10-CM

## 2023-07-26 NOTE — Patient Instructions (Signed)
Excellent job A+, GOLD STAR!  Continue CPAP as prescribed  Avoid Allergens and Irritants Avoid secondhand smoke Avoid SICK contacts Recommend  Masking  when appropriate Recommend Keep up-to-date with vaccinations   Be aware of reduced alertness and do not drive or operate heavy machinery if experiencing this or drowsiness.  Exercise encouraged, as tolerated. Encouraged proper weight management.  Important to get eight or more hours of sleep  Limiting the use of the computer and television before bedtime.  Decrease naps during the day, so night time sleep will become enhanced.  Limit caffeine, and sleep deprivation.

## 2023-07-26 NOTE — Progress Notes (Signed)
Name: Monica Curtis MRN: [id number] DOB: 09-14-1960    CHIEF COMPLAINT:  Follow-up assessment for sleep apnea   HISTORY OF PRESENT ILLNESS: Patient is here for follow-up sleep apnea assessment HST showed severe sleep apnea AHI of 40 Findings reviewed in detail with patient  Excellent compliance report Continue CPAP as prescribed AHI significantly reduced   Previous and current situation Works as a Conservation officer, nature at [employer] long naps due to excessive daytime sleepiness at the end of the day Non-smoker Nonalcoholic    Encouraged proper weight management.  Important to get eight or more hours of sleep  Limiting the use of the computer and television before bedtime.  Decrease naps during the day, so night time sleep will become enhanced.  Limit caffeine, and sleep deprivation.  HTN, stroke, uncontrolled diabetes and heart failure are potential risk factors.  Risk of untreated sleep apnea including cardiac arrhthymias, stroke, DM, pulm HTN.    No exacerbation at this time No evidence of heart failure at this time No evidence or signs of infection at this time No respiratory distress No fevers, chills, nausea, vomiting, diarrhea No evidence of lower extremity edema No evidence hemoptysis    PAST MEDICAL HISTORY :   has a past medical history of Depression, Diabetes mellitus without complication (HCC), Hyperlipidemia, and Hypertension.  has a past surgical history that includes Abdominal hysterectomy; Colonoscopy; Esophagogastroduodenoscopy; Cesarean section; Colonoscopy with propofol (N/A, 01/16/2018); and Colonoscopy with propofol (N/A, 12/23/2020). Prior to Admission medications   Medication Sig Start Date End Date Taking? Authorizing Provider  buPROPion (WELLBUTRIN XL) 300 MG 24 hr tablet Take 1 tablet (300 mg total) by mouth daily. 07/26/22 09/24/22  Neysa Hotter, MD  cabergoline (DOSTINEX) 0.5 MG tablet Take 0.5 mg by mouth 3 (three) times a week.    [provider]  cetirizine (ZYRTEC) 10 MG tablet Take 10 mg by mouth daily.    [provider]  clonazePAM (KLONOPIN) 0.25 MG disintegrating tablet Take 1 tablet (0.25 mg total) by mouth daily as needed (anxiety). 07/29/22 09/27/22  Neysa Hotter, MD  lamoTRIgine (LAMICTAL) 200 MG tablet Take 2 tablets (400 mg total) by mouth daily. 08/17/22 11/15/22  Neysa Hotter, MD  metroNIDAZOLE (METROCREAM) 0.75 % cream Apply topically. 04/19/22   [provider]  paliperidone (INVEGA SUSTENNA) 234 MG/1.5ML injection Inject 234 mg into the muscle every 28 (twenty-eight) days for 6 doses. 07/07/22 11/25/22  Neysa Hotter, MD  sertraline (ZOLOFT) 100 MG tablet Take 100 mg by mouth 2 (two) times daily.    [provider]  simvastatin (ZOCOR) 40 MG tablet Take 40 mg by mouth at bedtime. 03/16/22   [provider]   Allergies  Allergen Reactions   Sulfa Antibiotics Hives    FAMILY HISTORY:  family history includes Alcohol abuse in her father; Breast cancer in her paternal grandmother and sister; Depression in her sister; Drug abuse in her father. SOCIAL HISTORY:  reports that she has never smoked. She has never used smokeless tobacco. She reports that she does not currently use alcohol. She reports that she does not use drugs.   BP 138/76 (BP Location: Left Arm, Patient Position: Sitting, Cuff Size: Normal)   Pulse (!) 56   Temp 97.6 F (36.4 C) (Temporal)   Ht 5\' 4"  (1.626 m)   Wt 169 lb (76.7 kg)   SpO2 96%   BMI 29.01 kg/m      Review of Systems: Gen:  Denies  fever, sweats, chills weight loss  HEENT:  Denies blurred vision, double vision, ear pain, eye pain, hearing loss, nose bleeds, sore throat Cardiac:  No dizziness, chest pain or heaviness, chest tightness,edema, No JVD Resp:   No cough, -sputum production, -shortness of breath,-wheezing, -hemoptysis,  Other:  All other systems negative   Physical Examination:   General Appearance: No distress  EYES  PERRLA, EOM intact.   NECK Supple, No JVD Pulmonary: normal breath sounds, No wheezing.  CardiovascularNormal S1,S2.  No m/r/g.   Abdomen: Benign, Soft, non-tender. Neurology UE/LE 5/5 strength, no focal deficits Ext pulses intact, cap refill intact ALL OTHER ROS ARE NEGATIVE   ASSESSMENT AND PLAN SYNOPSIS  63 year old pleasant white female seen today for follow-up assessment for severe sleep apnea with AHI at 40   Assessment of Sleep apnea very severe OSA AHI 40 Patient has excellent compliance report Discussed in detail with patient OSA is well-controlled with CPAP Continue current prescription Auto CPAP 5-10 AHI was significantly reduced down to 4.6 Patient use and benefits from therapy  Patient Instructions Continue to use CPAP every night, minimum of 4-6 hours a night.  Change equipment every 30 days or as directed by DME.  Wash your tubing with warm soap and water daily, hang to dry. Wash humidifier portion weekly. Use bottled, distilled water and change daily   Be aware of reduced alertness and do not drive or operate heavy machinery if experiencing this or drowsiness.  Exercise encouraged, as tolerated. Encouraged proper weight management.  Important to get eight or more hours of sleep  Limiting the use of the computer and television before bedtime.  Decrease naps during the day, so night time sleep will become enhanced.  Limit caffeine, and sleep deprivation.  HTN, stroke, uncontrolled diabetes and heart failure are potential risk factors.  Risk of untreated sleep apnea including cardiac arrhthymias, stroke, DM, pulm HTN.  Compliance report reviewed in detail with patient  Obesity -recommend significant weight loss -recommend changing diet  Deconditioned state -Recommend increased daily activity and exercise    MEDICATION ADJUSTMENTS/LABS AND TESTS ORDERED:  Continue CPAP as prescribed  Avoid Allergens and Irritants Avoid secondhand smoke Avoid SICK  contacts Recommend  Masking  when appropriate Recommend Keep up-to-date with vaccinations    CURRENT MEDICATIONS REVIEWED AT LENGTH WITH PATIENT TODAY   Patient  satisfied with Plan of action and management. All questions answered  Follow up 6 months  Total Time Spent  42 mins   Lucie Leather, M.D.  [facility]  Medical Director [facility] Medical Director [facility] Cardio-Pulmonary Department

## 2023-08-12 LAB — IRON,TIBC AND FERRITIN PANEL
Ferritin: 63 ng/mL (ref 15–150)
Iron Saturation: 16 % (ref 15–55)
Iron: 53 ug/dL (ref 27–139)
Total Iron Binding Capacity: 332 ug/dL (ref 250–450)
UIBC: 279 ug/dL (ref 118–369)

## 2023-08-13 LAB — FOLATE: Folate: 5.9 ng/mL (ref >3.0)

## 2023-08-13 LAB — VITAMIN D 25 HYDROXY (VIT D DEFICIENCY, FRACTURES): Vit D, 25-Hydroxy: 13.8 ng/mL — ABNORMAL LOW (ref 30.0–100.0)

## 2023-08-13 LAB — VITAMIN B12: Vitamin B-12: 293 pg/mL (ref 232–1245)

## 2023-08-13 LAB — PROLACTIN: Prolactin: 67.4 ng/mL — ABNORMAL HIGH (ref 3.6–25.2)

## 2023-08-15 ENCOUNTER — Telehealth: Payer: Self-pay | Admitting: Psychiatry

## 2023-08-15 NOTE — Telephone Encounter (Signed)
 Reviewed patient's labs-resulted 08/13/2023  Vitamin D low at 13.8 Vitamin B12 borderline low at 293 Prolactin level elevated at 67.4  Otherwise lab within normal limits.  We will have patient contact primary care provider for replacement of vitamin D as well as probably vitamin B12.  Also patient on [place] which is likely contributing to elevated prolactin level.  Prolactin level elevated from 59-previous reading.  Will have staff contact patient to discuss.  Will defer to Dr. Vanetta Shawl to make medication adjustment as needed once she returns.

## 2023-08-15 NOTE — Telephone Encounter (Signed)
 Noted.

## 2023-08-15 NOTE — Telephone Encounter (Signed)
 Called patient to make aware of the lab results no answer left a message for patient to return call to office

## 2023-08-16 ENCOUNTER — Ambulatory Visit: Payer: No Typology Code available for payment source

## 2023-08-17 ENCOUNTER — Ambulatory Visit (INDEPENDENT_AMBULATORY_CARE_PROVIDER_SITE_OTHER)

## 2023-08-17 VITALS — BP 126/84 | HR 87 | Temp 98.6°F | Ht 64.0 in | Wt 166.2 lb

## 2023-08-17 DIAGNOSIS — F251 Schizoaffective disorder, depressive type: Secondary | ICD-10-CM | POA: Diagnosis not present

## 2023-08-17 DIAGNOSIS — F32A Depression, unspecified: Secondary | ICD-10-CM

## 2023-08-17 NOTE — Progress Notes (Signed)
 Patient in today for due 28 day Invega Sustenna 234 mg/1.5 ml IM injection. Patient presented clean and answered questions. Patient denied any auditory or visual hallucinations, no suicidal or homicidal ideations or plans or intent to harm self or others. She is still doing fine with every 28 day dosage.   Pt did point out that she had a know on each arm. Injection was not given around the area that had the knots.  Pt was advised that she should call a dermatologist or PCP about the knots.  She stated that she had one time before and they went away.  I told her to go see the ER, PCP or dermatologist ASAP epically, if the knots get bigger.     Injection given in the left deltoid. Patient tolerated due injection without any complaint of pain or discomfort and agreed to return in 28 days for next due injection.  Patient to call if any issues or problems after injection today or prior to next appointment.     NDC 95621-308-65   GTIN [id number]  EXP 08-2024  LOT # [id number]  S/N [id number].

## 2023-08-17 NOTE — Patient Instructions (Signed)
 Patient in today for due 28 day Invega Sustenna 234 mg/1.5 ml IM injection. Patient presented clean and answered questions. Patient denied any auditory or visual hallucinations, no suicidal or homicidal ideations or plans or intent to harm self or others. She is still doing fine with every 28 day dosage.   Pt did point out that she had a know on each arm. Injection was not given around the area that had the knots.  Pt was advised that she should call a dermatologist or PCP about the knots.  She stated that she had one time before and they went away.  I told her to go see the ER, PCP or dermatologist ASAP epically, if the knots get bigger.     Injection given in the left deltoid. Patient tolerated due injection without any complaint of pain or discomfort and agreed to return in 28 days for next due injection.  Patient to call if any issues or problems after injection today or prior to next appointment.     NDC 95621-308-65   GTIN [id number]  EXP 08-2024  LOT # [id number]  S/N [id number].
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
## 2023-08-29 ENCOUNTER — Ambulatory Visit: Payer: 59 | Admitting: Psychiatry

## 2023-08-29 ENCOUNTER — Encounter: Payer: Self-pay | Admitting: Psychiatry

## 2023-08-29 VITALS — BP 128/82 | HR 75 | Temp 98.4°F | Ht 64.0 in | Wt 168.4 lb

## 2023-08-29 DIAGNOSIS — R4189 Other symptoms and signs involving cognitive functions and awareness: Secondary | ICD-10-CM

## 2023-08-29 DIAGNOSIS — F411 Generalized anxiety disorder: Secondary | ICD-10-CM | POA: Diagnosis not present

## 2023-08-29 DIAGNOSIS — F251 Schizoaffective disorder, depressive type: Secondary | ICD-10-CM | POA: Diagnosis not present

## 2023-08-29 DIAGNOSIS — Z79899 Other long term (current) drug therapy: Secondary | ICD-10-CM

## 2023-08-29 DIAGNOSIS — R5383 Other fatigue: Secondary | ICD-10-CM

## 2023-08-29 DIAGNOSIS — E559 Vitamin D deficiency, unspecified: Secondary | ICD-10-CM

## 2023-08-29 DIAGNOSIS — G2401 Drug induced subacute dyskinesia: Secondary | ICD-10-CM | POA: Diagnosis not present

## 2023-08-29 MED ORDER — LAMOTRIGINE 200 MG PO TABS: 400.0000 mg | ORAL_TABLET | Freq: Every day | ORAL | 0 refills | Status: DC

## 2023-08-29 NOTE — Patient Instructions (Signed)
 Continue Invega Sustenna 234 mg IM q28 day Continue bupropion 450 mg daily  Continue lamotrigine 400 mg daily  Continue lexapro 20 mg daily  Continue clonazepam 0.25 mg ODT at night  Next appointment: 4/29 at 4:30

## 2023-09-14 ENCOUNTER — Other Ambulatory Visit: Payer: Self-pay

## 2023-09-14 ENCOUNTER — Ambulatory Visit

## 2023-09-14 VITALS — BP 160/79 | HR 60 | Temp 98.3°F | Ht 64.0 in | Wt 168.2 lb

## 2023-09-14 DIAGNOSIS — F32A Depression, unspecified: Secondary | ICD-10-CM | POA: Diagnosis not present

## 2023-09-14 DIAGNOSIS — F251 Schizoaffective disorder, depressive type: Secondary | ICD-10-CM

## 2023-09-14 NOTE — Patient Instructions (Signed)
 Patient present flat affect mood was pleasant and denied visual or auditory hallucinations. No suicidal or homicidal ideations, no plan, intent, or means to want to harm self or others. Patients Invega Sustenna 234 mg/1.5 ml IM injection prepared as ordered and administered to patient in her right deltoid. Patient tolerated without discomfort or pain. Patient will return in 28 days and will call if there is any changes.

## 2023-09-14 NOTE — Progress Notes (Unsigned)
 Patient present flat affect mood was pleasant and denied visual or auditory hallucinations. No suicidal or homicidal ideations, no plan, intent, or means to want to harm self or others. Patients Invega Sustenna 234 mg/1.5 ml IM injection prepared as ordered and administered to patient in her right deltoid. Patient tolerated without discomfort or pain. Patient will return in 28 days and will call if there is any changes.

## 2023-10-06 ENCOUNTER — Other Ambulatory Visit: Payer: Self-pay | Admitting: Psychiatry
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
## 2023-10-10 ENCOUNTER — Encounter: Payer: Self-pay | Admitting: Psychiatry

## 2023-10-10 ENCOUNTER — Ambulatory Visit (INDEPENDENT_AMBULATORY_CARE_PROVIDER_SITE_OTHER): Admitting: Psychiatry

## 2023-10-10 ENCOUNTER — Other Ambulatory Visit: Payer: Self-pay | Admitting: Psychiatry

## 2023-10-10 ENCOUNTER — Other Ambulatory Visit: Payer: Self-pay

## 2023-10-10 VITALS — BP 157/74 | HR 71 | Temp 98.3°F | Ht 64.0 in | Wt 169.8 lb

## 2023-10-10 DIAGNOSIS — F251 Schizoaffective disorder, depressive type: Secondary | ICD-10-CM

## 2023-10-10 DIAGNOSIS — E559 Vitamin D deficiency, unspecified: Secondary | ICD-10-CM

## 2023-10-10 DIAGNOSIS — F411 Generalized anxiety disorder: Secondary | ICD-10-CM | POA: Diagnosis not present

## 2023-10-10 DIAGNOSIS — G2401 Drug induced subacute dyskinesia: Secondary | ICD-10-CM | POA: Diagnosis not present

## 2023-10-10 DIAGNOSIS — R5383 Other fatigue: Secondary | ICD-10-CM

## 2023-10-10 DIAGNOSIS — R4189 Other symptoms and signs involving cognitive functions and awareness: Secondary | ICD-10-CM | POA: Diagnosis not present

## 2023-10-10 DIAGNOSIS — Z79899 Other long term (current) drug therapy: Secondary | ICD-10-CM

## 2023-10-10 MED ORDER — PALIPERIDONE PALMITATE ER 234 MG/1.5ML IM SUSY
234.0000 mg | PREFILLED_SYRINGE | INTRAMUSCULAR | Status: AC
  Administered 2023-10-12 – 2024-03-06 (×6): 234 mg via INTRAMUSCULAR

## 2023-10-10 MED ORDER — BUPROPION HCL ER (XL) 300 MG PO TB24: 300.0000 mg | ORAL_TABLET | Freq: Every day | ORAL | 1 refills | Status: DC

## 2023-10-10 NOTE — Patient Instructions (Addendum)
 Continue Invega  Sustenna 234 mg IM q28 day  Continue bupropion  450 mg daily  Continue lamotrigine  400 mg daily  Continue lexapro  20 mg daily  Continue clonazepam  0.25 mg ODT at night  Next appointment: 6/19 at 4:30 Please discuss about your low vitamin D  level (13.8)

## 2023-10-12 ENCOUNTER — Ambulatory Visit

## 2023-10-12 ENCOUNTER — Other Ambulatory Visit: Payer: Self-pay

## 2023-10-12 VITALS — BP 151/79 | HR 58 | Temp 98.2°F | Ht 64.0 in | Wt 169.0 lb

## 2023-10-12 DIAGNOSIS — F32A Depression, unspecified: Secondary | ICD-10-CM

## 2023-10-12 DIAGNOSIS — F259 Schizoaffective disorder, unspecified: Secondary | ICD-10-CM | POA: Diagnosis not present

## 2023-10-12 NOTE — Patient Instructions (Addendum)
 Patient present flat affect mood was pleasant and denied visual or auditory hallucinations. No suicidal or homicidal ideations, no plan, intent, or means to want to harm self or others. Patients Invega  Sustenna 234 mg/1.5 ml IM injection prepared as ordered and administered to patient in her left deltoid. Patient tolerated without discomfort or pain. Patient will return in 28 days and will call if there is any changes.   Laban Pia: [id number]  S/N: [id number]  EXP: 06-12-24  LOT: [id number]  NDC: 30865-784-69

## 2023-10-12 NOTE — Progress Notes (Signed)
 Patient present flat affect mood was pleasant and denied visual or auditory hallucinations. No suicidal or homicidal ideations, no plan, intent, or means to want to harm self or others. Patients Invega  Sustenna 234 mg/1.5 ml IM injection prepared as ordered and administered to patient in her left deltoid. Patient tolerated without discomfort or pain. Patient will return in 28 days and will call if there is any changes.   Laban Pia: [id number]  S/N: [id number]  EXP: 06-12-24  LOT: [id number]  NDC: 30865-784-69

## 2023-11-03 ENCOUNTER — Ambulatory Visit
Admission: EM | Admit: 2023-11-03 | Discharge: 2023-11-03 | Disposition: A | Discharge status: Home / Self Care | Attending: Emergency Medicine | Admitting: Emergency Medicine

## 2023-11-03 DIAGNOSIS — R0981 Nasal congestion: Secondary | ICD-10-CM | POA: Diagnosis not present

## 2023-11-03 DIAGNOSIS — H60392 Other infective otitis externa, left ear: Secondary | ICD-10-CM | POA: Diagnosis not present

## 2023-11-03 LAB — POC COVID19/FLU A&B COMBO
Covid Antigen, POC: NEGATIVE
Influenza A Antigen, POC: NEGATIVE
Influenza B Antigen, POC: NEGATIVE

## 2023-11-03 MED ORDER — PREDNISONE 10 MG (21) PO TBPK: ORAL_TABLET | Freq: Every day | ORAL | 0 refills | Status: DC

## 2023-11-03 MED ORDER — OFLOXACIN 0.3 % OT SOLN: 10.0000 [drp] | Freq: Every day | OTIC | 0 refills | Status: AC

## 2023-11-03 NOTE — ED Triage Notes (Signed)
 Itchy throat, sneezing, congestion, ear pain x 1 day.

## 2023-11-03 NOTE — ED Provider Notes (Signed)
 Monica Curtis    CSN: [id number] Arrival date & time: 11/03/23  1422      History   Chief Complaint Chief Complaint  Patient presents with   Nasal Congestion    HPI Monica Curtis is a 63 y.o. female.   Patient presents for evaluation of left-sided ear pain, nasal congestion, itchy throat, intermittent sinus pressure and postnasal drip beginning 2 days ago.  Endorses symptoms began after drinking a chai tea therefore she believes possible allergic reaction.  Has had tea before and endorses she has felt mild congestion but never to this extent.  No known sick contacts.  Has not attempted treatment.  Tolerating food and liquids.  Denies fever, cough, shortness of breath or wheezing.  Past Medical History:  Diagnosis Date   Depression    Diabetes mellitus without complication (HCC)    Hyperlipidemia    Hypertension     Patient Active Problem List   Diagnosis Date Noted   Schizoaffective disorder (HCC) 05/10/2020   Drug-induced parkinsonism (HCC) 05/10/2020   Diabetes mellitus type 2, uncomplicated (HCC) 12/19/2019   Hyperlipidemia 12/19/2019   Hypertension 12/19/2019   Hyperprolactinemia (HCC) 12/21/2016   Depression 11/12/2014   Benign positional vertigo, unspecified laterality 10/27/2014   Headache, unspecified headache type 10/27/2014    Past Surgical History:  Procedure Laterality Date   ABDOMINAL HYSTERECTOMY     CESAREAN SECTION     COLONOSCOPY     COLONOSCOPY WITH PROPOFOL  N/A 01/16/2018   Procedure: COLONOSCOPY WITH PROPOFOL ;  Surgeon: Deveron Fly, MD;  Location: [facility] ENDOSCOPY;  Service: Endoscopy;  Laterality: N/A;   COLONOSCOPY WITH PROPOFOL  N/A 12/23/2020   Procedure: COLONOSCOPY WITH PROPOFOL ;  Surgeon: Toledo, Alphonsus Jeans, MD;  Location: [facility] ENDOSCOPY;  Service: Gastroenterology;  Laterality: N/A;   ESOPHAGOGASTRODUODENOSCOPY      OB History   No obstetric history on file.      Home Medications    Prior to Admission medications    Medication Sig Start Date End Date Taking? Authorizing Provider  buPROPion  (WELLBUTRIN  XL) 150 MG 24 hr tablet Take 1 tablet (150 mg total) by mouth daily. Takes a total of 450 mg daily. Take along with 300 mg tab 08/02/23 01/29/24 Yes Hisada, Ivan Marion, MD  buPROPion  (WELLBUTRIN  XL) 300 MG 24 hr tablet Take 1 tablet (300 mg total) by mouth daily. Total of 450 mg daily. Take along with 150 mg tab 10/17/23 04/14/24 Yes Hisada, Ivan Marion, MD  cabergoline (DOSTINEX) 0.5 MG tablet Take 0.5 mg by mouth 2 (two) times a week.   Yes [provider]  clonazePAM  (KLONOPIN ) 0.25 MG disintegrating tablet Take 1 tablet (0.25 mg total) by mouth at bedtime as needed (anxiety). 10/07/23 12/06/23 Yes Hisada, Ivan Marion, MD  escitalopram  (LEXAPRO ) 20 MG tablet Take 1 tablet (20 mg total) by mouth daily. 06/29/23 12/26/23 Yes Hisada, Ivan Marion, MD  lamoTRIgine  (LAMICTAL ) 200 MG tablet Take 2 tablets (400 mg total) by mouth daily. 09/13/23 12/12/23 Yes Hisada, Ivan Marion, MD  ofloxacin (FLOXIN) 0.3 % OTIC solution Place 10 drops into the left ear daily. 11/03/23  Yes Kerryann Allaire, Maybelle Spatz, NP  paliperidone  (INVEGA  SUSTENNA) 234 MG/1.5ML injection Inject 234 mg into the muscle every 28 (twenty-eight) days for 6 doses. 11/25/22 11/03/23 Yes Hisada, Ivan Marion, MD  predniSONE (STERAPRED UNI-PAK 21 TAB) 10 MG (21) TBPK tablet Take by mouth daily. Take 6 tabs by mouth daily  for 1 days, then 5 tabs for 1 days, then 4 tabs for 1 days, then 3 tabs for 1 days,  2 tabs for 1 days, then 1 tab by mouth daily for 1 days 11/03/23  Yes Nessie Nong R, NP  simvastatin (ZOCOR) 40 MG tablet Take 40 mg by mouth at bedtime. 03/16/22  Yes [provider]  cetirizine (ZYRTEC) 10 MG tablet Take 10 mg by mouth daily.    [provider]    Family History Family History  Problem Relation Age of Onset   Drug abuse Father    Alcohol abuse Father    Depression Sister    Breast cancer Sister    Breast cancer Paternal Grandmother     Social History Social  History   Tobacco Use   Smoking status: Never   Smokeless tobacco: Never  Vaping Use   Vaping status: Never Used  Substance Use Topics   Alcohol use: Not Currently   Drug use: Never     Allergies   Sulfa antibiotics   Review of Systems Review of Systems   Physical Exam Triage Vital Signs ED Triage Vitals  Encounter Vitals Group     BP 11/03/23 1450 (!) 165/73     Systolic BP Percentile --      Diastolic BP Percentile --      Pulse Rate 11/03/23 1450 76     Resp 11/03/23 1450 20     Temp 11/03/23 1450 98.5 F (36.9 C)     Temp Source 11/03/23 1450 Oral     SpO2 11/03/23 1450 97 %     Weight --      Height --      Head Circumference --      Peak Flow --      Pain Score 11/03/23 1452 3     Pain Loc --      Pain Education --      Exclude from Growth Chart --    No data found.  Updated Vital Signs BP (!) 165/73 (BP Location: Left Arm)   Pulse 76   Temp 98.5 F (36.9 C) (Oral)   Resp 20   SpO2 97%   Visual Acuity Right Eye Distance:   Left Eye Distance:   Bilateral Distance:    Right Eye Near:   Left Eye Near:    Bilateral Near:     Physical Exam Constitutional:      Appearance: Normal appearance.  HENT:     Head: Normocephalic.     Right Ear: Tympanic membrane, ear canal and external ear normal.     Ears:     Comments: Altagracia Rone purulent drainage present to the left ear canal with mild swelling, no erythema noted unable to visualize the tympanic membrane    Nose: Congestion present.     Mouth/Throat:     Mouth: Mucous membranes are moist.     Pharynx: Oropharynx is clear. No oropharyngeal exudate or posterior oropharyngeal erythema.  Eyes:     Extraocular Movements: Extraocular movements intact.  Pulmonary:     Effort: Pulmonary effort is normal.  Neurological:     General: No focal deficit present.     Mental Status: She is alert and oriented to person, place, and time. Mental status is at baseline.      UC Treatments / Results  Labs (all  labs ordered are listed, but only abnormal results are displayed) Labs Reviewed  POC COVID19/FLU A&B COMBO - Normal    EKG   Radiology No results found.  Procedures Procedures (including critical care time)  Medications Ordered in UC Medications - No data to  display  Initial Impression / Assessment and Plan / UC Course  I have reviewed the triage vital signs and the nursing notes.  Pertinent labs & imaging results that were available during my care of the patient were reviewed by me and considered in my medical decision making (see chart for details).  Nasal Congestion, infective otitis externa of left ear  Vital signs are stable, patient in no signs of distress nontoxic-appearing, congestion present to the nasal turbinates with a scant amount of blood within the left nare, discussed findings with patient, discussed purulent drainage within the left ear canal consistent with infection therefore initiated ofloxacin, with very low suspicion for an allergic reaction as presentation and symptomology is not consistent, also has had trigger prior without complication, discussed this with patient, prescribed prednisone for management of sinus pressure, recommended beginning use of mucolytic for additional supportive care, may use additional over-the-counter medications as needed with follow-up with urgent care as needed Final Clinical Impressions(s) / UC Diagnoses   Final diagnoses:  Nasal congestion  Infective otitis externa of left ear   Discharge Instructions      Symptoms today are most consistent with a viral illness but as you believe you have had a reaction to try tea we will initiate steroids  Begin prednisone every morning with food as directed, this will help with sinus pressure and any allergic reaction that is occurring  On exam there is Adelynne Joerger puslike drainage in your left ear which is consistent with infection, placed 10 drops of ofloxacin into the ear every morning for 7  days  You can take Tylenol  as needed for fever reduction and pain relief.   For cough: honey 1/2 to 1 teaspoon (you can dilute the honey in water or another fluid).  You can also use guaifenesin and dextromethorphan for cough. You can use a humidifier for chest congestion and cough.  If you don't have a humidifier, you can sit in the bathroom with the hot shower running.      For sore throat: try warm salt water gargles, cepacol lozenges, throat spray, warm tea or water with lemon/honey, popsicles or ice, or OTC cold relief medicine for throat discomfort.   For congestion: take a daily anti-histamine like Zyrtec, Claritin, and a oral decongestant, such as pseudoephedrine.  You can also use Flonase 1-2 sprays in each nostril daily.   It is important to stay hydrated: drink plenty of fluids (water, gatorade/powerade/pedialyte, juices, or teas) to keep your throat moisturized and help further relieve irritation/discomfort.   ED Prescriptions     Medication Sig Dispense Auth. Provider   ofloxacin (FLOXIN) 0.3 % OTIC solution Place 10 drops into the left ear daily. 5 mL Arlene Brickel R, NP   predniSONE (STERAPRED UNI-PAK 21 TAB) 10 MG (21) TBPK tablet Take by mouth daily. Take 6 tabs by mouth daily  for 1 days, then 5 tabs for 1 days, then 4 tabs for 1 days, then 3 tabs for 1 days, 2 tabs for 1 days, then 1 tab by mouth daily for 1 days 21 tablet Jaedynn Bohlken, Maybelle Spatz, NP      PDMP not reviewed this encounter.   Reena Canning, [place] 11/03/23 [phone]

## 2023-11-03 NOTE — Discharge Instructions (Signed)
 Symptoms today are most consistent with a viral illness but as you believe you have had a reaction to try tea we will initiate steroids  Begin prednisone every morning with food as directed, this will help with sinus pressure and any allergic reaction that is occurring  On exam there is Monica Curtis puslike drainage in your left ear which is consistent with infection, placed 10 drops of ofloxacin into the ear every morning for 7 days  You can take Tylenol  as needed for fever reduction and pain relief.   For cough: honey 1/2 to 1 teaspoon (you can dilute the honey in water or another fluid).  You can also use guaifenesin and dextromethorphan for cough. You can use a humidifier for chest congestion and cough.  If you don't have a humidifier, you can sit in the bathroom with the hot shower running.      For sore throat: try warm salt water gargles, cepacol lozenges, throat spray, warm tea or water with lemon/honey, popsicles or ice, or OTC cold relief medicine for throat discomfort.   For congestion: take a daily anti-histamine like Zyrtec, Claritin, and a oral decongestant, such as pseudoephedrine.  You can also use Flonase 1-2 sprays in each nostril daily.   It is important to stay hydrated: drink plenty of fluids (water, gatorade/powerade/pedialyte, juices, or teas) to keep your throat moisturized and help further relieve irritation/discomfort.

## 2023-11-09 ENCOUNTER — Other Ambulatory Visit: Payer: Self-pay

## 2023-11-09 ENCOUNTER — Ambulatory Visit (INDEPENDENT_AMBULATORY_CARE_PROVIDER_SITE_OTHER)

## 2023-11-09 VITALS — BP 160/79 | HR 64 | Temp 98.3°F | Ht 64.0 in | Wt 168.4 lb

## 2023-11-09 DIAGNOSIS — F259 Schizoaffective disorder, unspecified: Secondary | ICD-10-CM

## 2023-11-09 DIAGNOSIS — F329 Major depressive disorder, single episode, unspecified: Secondary | ICD-10-CM

## 2023-11-09 NOTE — Progress Notes (Unsigned)
 Patient present flat affect mood was pleasant and denied visual or auditory hallucinations. No suicidal or homicidal ideations, no plan, intent, or means to want to harm self or others. Patients Invega  Sustenna 234 mg/1.5 ml IM injection prepared as ordered and administered to patient in her right deltoid. Patient tolerated without discomfort or pain. Patient will return in 28 days and will call if there is any changes.    Laban Pia: [phone]   S/N: [id number]   EXP: 12-10-2024   LOT: [id number]   NDC: 84696-295-28

## 2023-11-09 NOTE — Patient Instructions (Signed)
 Patient present flat affect mood was pleasant and denied visual or auditory hallucinations. No suicidal or homicidal ideations, no plan, intent, or means to want to harm self or others. Patients Invega  Sustenna 234 mg/1.5 ml IM injection prepared as ordered and administered to patient in her right deltoid. Patient tolerated without discomfort or pain. Patient will return in 28 days and will call if there is any changes.    Laban Pia: [phone]   S/N: [id number]   EXP: 12-10-2024   LOT: [id number]   NDC: 84696-295-28

## 2023-11-16 ENCOUNTER — Emergency Department
Admission: EM | Admit: 2023-11-16 | Discharge: 2023-11-16 | Disposition: A | Discharge status: Home / Self Care | Attending: Emergency Medicine | Admitting: Emergency Medicine

## 2023-11-16 ENCOUNTER — Encounter: Payer: Self-pay | Admitting: Emergency Medicine

## 2023-11-16 ENCOUNTER — Emergency Department

## 2023-11-16 ENCOUNTER — Other Ambulatory Visit: Payer: Self-pay

## 2023-11-16 DIAGNOSIS — S0990XA Unspecified injury of head, initial encounter: Secondary | ICD-10-CM | POA: Diagnosis present

## 2023-11-16 DIAGNOSIS — I10 Essential (primary) hypertension: Secondary | ICD-10-CM | POA: Diagnosis not present

## 2023-11-16 DIAGNOSIS — W01198A Fall on same level from slipping, tripping and stumbling with subsequent striking against other object, initial encounter: Secondary | ICD-10-CM | POA: Insufficient documentation

## 2023-11-16 DIAGNOSIS — S060X0A Concussion without loss of consciousness, initial encounter: Secondary | ICD-10-CM | POA: Diagnosis not present

## 2023-11-16 DIAGNOSIS — W19XXXA Unspecified fall, initial encounter: Secondary | ICD-10-CM

## 2023-11-16 DIAGNOSIS — E119 Type 2 diabetes mellitus without complications: Secondary | ICD-10-CM | POA: Insufficient documentation

## 2023-11-16 NOTE — ED Triage Notes (Signed)
 Patient to ED via POV for a fall that occurred yesterday. PT states she was reaching for her CPAP when she lost her balance and fell. PT states she hit head on floor. Denies LOC or blood thinners. PT reports feeling dizzy today.

## 2023-11-16 NOTE — ED Notes (Signed)
 See triage notes. Patient c/o headache and rib pain secondary to a fall yesterday morning. Patient denies LOC and stated the rib and head pain is mostly related to when she moves.

## 2023-11-16 NOTE — ED Provider Notes (Signed)
 [facility] Provider Note   Event Date/Time   First MD Initiated Contact with Patient 11/16/23 1550     (approximate) History  Fall  HPI Monica Curtis is a 63 y.o. female with a past medical history of BPPV, type 2 diabetes, hyperlipidemia, hypertension, and schizoaffective disorder who presents complaining of dizziness and mild nausea after striking the left side of her head on a wall 1 day prior to arrival.  Patient states she has had mild nausea and dizziness since striking her head. ROS: Patient currently denies any vision changes, tinnitus, difficulty speaking, facial droop, sore throat, chest pain, shortness of breath, abdominal pain, vomiting/diarrhea, dysuria, or weakness/numbness/paresthesias in any extremity   Physical Exam  Triage Vital Signs: ED Triage Vitals [11/16/23 1532]  Encounter Vitals Group     BP (!) 144/86     Systolic BP Percentile      Diastolic BP Percentile      Pulse Rate 66     Resp 17     Temp 99.4 F (37.4 C)     Temp Source Oral     SpO2 97 %     Weight 167 lb (75.8 kg)     Height 5' 4.5" (1.638 m)     Head Circumference      Peak Flow      Pain Score 2     Pain Loc      Pain Education      Exclude from Growth Chart    Most recent vital signs: Vitals:   11/16/23 1532  BP: (!) 144/86  Pulse: 66  Resp: 17  Temp: 99.4 F (37.4 C)  SpO2: 97%   General: Awake, oriented x4. CV:  Good peripheral perfusion. Resp:  Normal effort. Abd:  No distention. Other:  Middle-aged overweight Caucasian female resting comfortably in no acute distress ED Results / Procedures / Treatments  Labs (all labs ordered are listed, but only abnormal results are displayed) Labs Reviewed - No data to display RADIOLOGY ED MD interpretation: CT of the head without contrast interpreted by me shows no evidence of acute abnormalities including no intracerebral hemorrhage, obvious masses, or significant edema - All radiology independently  interpreted and agree with radiology assessment Official radiology report(s): CT Head Wo Contrast Result Date: 11/16/2023 CLINICAL DATA:  Altered level of consciousness, head trauma EXAM: CT HEAD WITHOUT CONTRAST TECHNIQUE: Contiguous axial images were obtained from the base of the skull through the vertex without intravenous contrast. RADIATION DOSE REDUCTION: This exam was performed according to the departmental dose-optimization program which includes automated exposure control, adjustment of the mA and/or kV according to patient size and/or use of iterative reconstruction technique. COMPARISON:  07/05/2023 FINDINGS: Brain: No acute infarct or hemorrhage. Lateral ventricles and midline structures are unremarkable. No acute extra-axial fluid collections. No mass effect. Vascular: No hyperdense vessel or unexpected calcification. Skull: Normal. Negative for fracture or focal lesion. Sinuses/Orbits: No acute finding. Decreased fluid within the left sphenoid sinus. Other: None. IMPRESSION: 1. Stable head CT, no acute intracranial process. Electronically Signed   By: Bobbye Burrow M.D.   On: 11/16/2023 17:12   PROCEDURES: Critical Care performed: No Procedures MEDICATIONS ORDERED IN ED: Medications - No data to display IMPRESSION / MDM / ASSESSMENT AND PLAN / ED COURSE  I reviewed the triage vital signs and the nursing notes.  The patient is on the cardiac monitor to evaluate for evidence of arrhythmia and/or significant heart rate changes. Patient's presentation is most consistent with acute presentation with potential threat to life or bodily function. Patient presenting with head trauma.  Patient's neurological exam was non-focal and unremarkable.  [place] Head CT Rule was applied and patient did not fall into the low risk category so a head CT was obtained.  This showed no significant findings.  At this time, it is felt that the most likely explanation for the patient's  symptoms is concussion.   I also considered SAH, SDH, Epidural Hematoma, IPH, skull fracture, migraine but this appears less likely considering the data gathered thus far.   Patient provided concussion protocol.   Patient remained stable and neurologically intact while in the emergency department.  Discussed warning signs that would prompt return to ED.  Head trauma handout was provided.  Discussed in detail concussion management.  No sports or strenuous activity until symptoms free.  Return to emergency department urgently if new or worsening symptoms develop.    Impression:  Concussion Fall  Plan  Discharge from ED Tylenol for pain control. Avoid aspirin, NSAIDs, or other blood thinners. Advised patient on supportive measures for cognitive rest - avoid use of cognitive function for at least 24 hours.  This means no tv, books, texting, computers, etc. Limit visitors to the house.  Head trauma instructions provided in discharge instructions Instructed Pt to monitor for neurologic symptoms, severe HA, change in mental status, seizures, loss of conciousness. Instructed Pt to f/up w/ PCP in 3 days or ETC should symptoms worsen or not improve. Pt verbally expressed understanding and all questions were addressed to Pt's satisfaction.   FINAL CLINICAL IMPRESSION(S) / ED DIAGNOSES   Final diagnoses:  Fall, initial encounter  Concussion without loss of consciousness, initial encounter   Rx / DC Orders   ED Discharge Orders     None      Note:  This document was prepared using Dragon voice recognition software and may include unintentional dictation errors.   [name], MD 11/16/23 [phone]
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
## 2023-11-30 ENCOUNTER — Other Ambulatory Visit: Payer: Self-pay

## 2023-11-30 ENCOUNTER — Ambulatory Visit (INDEPENDENT_AMBULATORY_CARE_PROVIDER_SITE_OTHER): Admitting: Psychiatry

## 2023-11-30 ENCOUNTER — Encounter: Payer: Self-pay | Admitting: Psychiatry

## 2023-11-30 VITALS — BP 134/71 | HR 66 | Temp 97.8°F | Ht 64.5 in | Wt 169.2 lb

## 2023-11-30 DIAGNOSIS — G2401 Drug induced subacute dyskinesia: Secondary | ICD-10-CM

## 2023-11-30 DIAGNOSIS — F411 Generalized anxiety disorder: Secondary | ICD-10-CM | POA: Diagnosis not present

## 2023-11-30 DIAGNOSIS — F251 Schizoaffective disorder, depressive type: Secondary | ICD-10-CM

## 2023-11-30 DIAGNOSIS — Z79899 Other long term (current) drug therapy: Secondary | ICD-10-CM

## 2023-11-30 DIAGNOSIS — R4189 Other symptoms and signs involving cognitive functions and awareness: Secondary | ICD-10-CM

## 2023-11-30 MED ORDER — ESCITALOPRAM OXALATE 10 MG PO TABS: 10.0000 mg | ORAL_TABLET | Freq: Every day | ORAL | 0 refills | Status: DC

## 2023-11-30 MED ORDER — VENLAFAXINE HCL ER 150 MG PO CP24: 150.0000 mg | ORAL_CAPSULE | Freq: Every day | ORAL | 1 refills | Status: DC

## 2023-11-30 MED ORDER — VENLAFAXINE HCL ER 75 MG PO CP24: 75.0000 mg | ORAL_CAPSULE | Freq: Every day | ORAL | 0 refills | Status: DC

## 2023-11-30 NOTE — Patient Instructions (Addendum)
 Continue Invega  Sustenna 234 mg IM q28 day  Continue bupropion  450 mg daily  Continue lamotrigine  400 mg daily  Week 1: lexapro  10 mg daily, venlafaxine 75 mg daily        Week 2- : Discontinue lexapro . Increase venlafaxine 150 mg daily        Week 3- continue venlafaxine 150 mg daily  Continue clonazepam  0.25 mg ODT at night  Next appointment: 7/28 at 4:30

## 2023-11-30 NOTE — Addendum Note (Signed)
 Addended by: Raevon Broom on: 11/30/2023 05:50 PM   Modules accepted: Level of Service

## 2023-12-01 ENCOUNTER — Other Ambulatory Visit: Payer: Self-pay | Admitting: Psychiatry

## 2023-12-01 ENCOUNTER — Telehealth: Payer: Self-pay

## 2023-12-01 MED ORDER — INVEGA SUSTENNA 234 MG/1.5ML IM SUSY: 234.0000 mg | PREFILLED_SYRINGE | INTRAMUSCULAR | 5 refills | Status: DC

## 2023-12-01 NOTE — Telephone Encounter (Signed)
 Received fax from pharmacy requesting a refill of paliperidone  (INVEGA  SUSTENNA) 234 MG/1.5ML injection  spoke to [facility] he stated that the prescription has expired and they need a new one please advise   Last visit 11-30-23 Next visit 01-08-24  Preferred pharmacy   [id number] [place], [place] - [address] Phone: [phone]  Fax: [phone]

## 2023-12-01 NOTE — Telephone Encounter (Signed)
 Ordered

## 2023-12-04 ENCOUNTER — Telehealth: Payer: Self-pay

## 2023-12-04 NOTE — Telephone Encounter (Signed)
 pt was confused about the lexapro  and the venlafaxine  changes. went over the directions that she is to take the lexapro  10mg  for one week also start taking the venlafaxine  75. week 2 stop the lexapro  and increase the venlafaxine  to 150mg  daily. week 3 continue venlqfaxine 150mg  daily

## 2023-12-07 ENCOUNTER — Ambulatory Visit

## 2023-12-07 ENCOUNTER — Other Ambulatory Visit: Payer: Self-pay

## 2023-12-07 VITALS — BP 137/74 | HR 69 | Temp 98.7°F | Ht 64.5 in | Wt 169.0 lb

## 2023-12-07 DIAGNOSIS — F259 Schizoaffective disorder, unspecified: Secondary | ICD-10-CM | POA: Diagnosis not present

## 2023-12-07 DIAGNOSIS — F32A Depression, unspecified: Secondary | ICD-10-CM

## 2023-12-07 NOTE — Patient Instructions (Signed)
 Patient present flat affect mood was pleasant and denied visual or auditory hallucinations. No suicidal or homicidal ideations, no plan, intent, or means to want to harm self or others. Patients Invega  Sustenna 234 mg/1.5 ml IM injection prepared as ordered and administered to patient in her left deltoid. Patient tolerated without discomfort or pain. Patient will return in 28 days and will call if there is any changes.  NDC 49541-435-98  GTIN [id number]  S/N [id number]  EXP 03-12-2025  LOT [id number]

## 2023-12-07 NOTE — Progress Notes (Signed)
 Patient present flat affect mood was pleasant and denied visual or auditory hallucinations. No suicidal or homicidal ideations, no plan, intent, or means to want to harm self or others. Patients Invega  Sustenna 234 mg/1.5 ml IM injection prepared as ordered and administered to patient in her left deltoid. Patient tolerated without discomfort or pain. Patient will return in 28 days and will call if there is any changes.  NDC 49541-435-98  GTIN [id number]  S/N [id number]  EXP 03-12-2025  LOT [id number]

## 2023-12-08 ENCOUNTER — Other Ambulatory Visit: Payer: Self-pay | Admitting: Psychiatry

## 2023-12-22 ENCOUNTER — Other Ambulatory Visit: Payer: Self-pay | Admitting: Psychiatry

## 2024-01-04 ENCOUNTER — Ambulatory Visit (INDEPENDENT_AMBULATORY_CARE_PROVIDER_SITE_OTHER)

## 2024-01-04 ENCOUNTER — Other Ambulatory Visit: Payer: Self-pay

## 2024-01-04 VITALS — BP 144/80 | HR 76 | Temp 98.8°F | Ht 64.5 in | Wt 167.2 lb

## 2024-01-04 DIAGNOSIS — F32A Depression, unspecified: Secondary | ICD-10-CM

## 2024-01-04 DIAGNOSIS — F259 Schizoaffective disorder, unspecified: Secondary | ICD-10-CM | POA: Diagnosis not present
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
## 2024-01-04 NOTE — Patient Instructions (Signed)
 Patient present flat affect mood was pleasant and denied visual or auditory hallucinations. No suicidal or homicidal ideations, no plan, intent, or means to want to harm self or others. Patients Invega  Sustenna 234 mg/1.5 ml IM injection prepared as ordered and administered to patient in her right deltoid. Patient tolerated without discomfort or pain. Patient will return in 28 days and will call if there is any changes.   NDC 49541-435-98   GTIN [id number]   S/N [id number]   EXP 04-12-2025   LOT [id number]

## 2024-01-04 NOTE — Progress Notes (Unsigned)
 Patient present flat affect mood was pleasant and denied visual or auditory hallucinations. No suicidal or homicidal ideations, no plan, intent, or means to want to harm self or others. Patients Invega  Sustenna 234 mg/1.5 ml IM injection prepared as ordered and administered to patient in her right deltoid. Patient tolerated without discomfort or pain. Patient will return in 28 days and will call if there is any changes.   NDC 49541-435-98   GTIN [id number]   S/N [id number]   EXP 04-12-2025   LOT [id number]

## 2024-01-08 ENCOUNTER — Other Ambulatory Visit: Payer: Self-pay

## 2024-01-08 ENCOUNTER — Ambulatory Visit (INDEPENDENT_AMBULATORY_CARE_PROVIDER_SITE_OTHER): Admitting: Psychiatry

## 2024-01-08 ENCOUNTER — Encounter: Payer: Self-pay | Admitting: Psychiatry

## 2024-01-08 VITALS — BP 148/82 | HR 73 | Temp 97.8°F | Ht 64.5 in | Wt 167.8 lb

## 2024-01-08 DIAGNOSIS — F259 Schizoaffective disorder, unspecified: Secondary | ICD-10-CM | POA: Diagnosis not present

## 2024-01-08 DIAGNOSIS — G2401 Drug induced subacute dyskinesia: Secondary | ICD-10-CM

## 2024-01-08 DIAGNOSIS — Z79899 Other long term (current) drug therapy: Secondary | ICD-10-CM

## 2024-01-08 DIAGNOSIS — R4189 Other symptoms and signs involving cognitive functions and awareness: Secondary | ICD-10-CM

## 2024-01-08 DIAGNOSIS — F411 Generalized anxiety disorder: Secondary | ICD-10-CM

## 2024-01-08 MED ORDER — BUPROPION HCL ER (XL) 150 MG PO TB24: 150.0000 mg | ORAL_TABLET | Freq: Every day | ORAL | 1 refills | Status: AC

## 2024-01-08 MED ORDER — LAMOTRIGINE 200 MG PO TABS: 400.0000 mg | ORAL_TABLET | Freq: Every day | ORAL | 1 refills | Status: DC

## 2024-01-08 NOTE — Addendum Note (Signed)
 Addended by: Tacora Athanas on: 01/08/2024 06:20 PM   Modules accepted: Orders

## 2024-01-08 NOTE — Patient Instructions (Signed)
 Continue Invega  Sustenna 234 mg IM q28 day  Continue bupropion  450 mg daily  Continue lamotrigine  400 mg daily  Continue venlafaxine  150 mg daily  Continue clonazepam  0.25 mg ODT at night  Next appointment:  9/8 at 4:30, IP Referral to neurology

## 2024-01-09 NOTE — Addendum Note (Signed)
 Addended by: Cameka Rae on: 01/09/2024 01:34 PM   Modules accepted: Level of Service

## 2024-02-01 ENCOUNTER — Encounter: Payer: Self-pay | Admitting: Internal Medicine

## 2024-02-01 ENCOUNTER — Ambulatory Visit (INDEPENDENT_AMBULATORY_CARE_PROVIDER_SITE_OTHER)

## 2024-02-01 ENCOUNTER — Ambulatory Visit: Admitting: Internal Medicine

## 2024-02-01 ENCOUNTER — Other Ambulatory Visit: Payer: Self-pay

## 2024-02-01 VITALS — BP 142/93 | HR 77 | Temp 98.1°F | Ht 64.5 in | Wt 168.8 lb

## 2024-02-01 VITALS — BP 126/80 | HR 76 | Temp 99.1°F | Ht 64.5 in | Wt 168.2 lb

## 2024-02-01 DIAGNOSIS — R251 Tremor, unspecified: Secondary | ICD-10-CM

## 2024-02-01 DIAGNOSIS — F251 Schizoaffective disorder, depressive type: Secondary | ICD-10-CM | POA: Diagnosis not present

## 2024-02-01 DIAGNOSIS — G20C Parkinsonism, unspecified: Secondary | ICD-10-CM | POA: Diagnosis not present

## 2024-02-01 DIAGNOSIS — G4733 Obstructive sleep apnea (adult) (pediatric): Secondary | ICD-10-CM | POA: Diagnosis not present

## 2024-02-01 DIAGNOSIS — G2119 Other drug induced secondary parkinsonism: Secondary | ICD-10-CM | POA: Diagnosis not present

## 2024-02-01 DIAGNOSIS — F32A Depression, unspecified: Secondary | ICD-10-CM | POA: Diagnosis not present

## 2024-02-01 NOTE — Patient Instructions (Signed)
 Patient present flat affect mood was pleasant and denied visual or auditory hallucinations. No suicidal or homicidal ideations, no plan, intent, or means to want to harm self or others. Patients Invega  Sustenna 234 mg/1.5 ml IM injection prepared as ordered and administered to patient in her left deltoid. Patient tolerated without discomfort or pain. Patient will return in 28 days and will call if there is any changes.   NDC 49541-435-98   GTIN [id number]   S/N [id number]   EXP 06-12-2025   LOT [id number]

## 2024-02-01 NOTE — Patient Instructions (Signed)

## 2024-02-01 NOTE — Progress Notes (Unsigned)
 Patient present flat affect mood was pleasant and denied visual or auditory hallucinations. No suicidal or homicidal ideations, no plan, intent, or means to want to harm self or others. Patients Invega  Sustenna 234 mg/1.5 ml IM injection prepared as ordered and administered to patient in her left deltoid. Patient tolerated without discomfort or pain. Patient will return in 28 days and will call if there is any changes.   NDC 49541-435-98   GTIN [id number]   S/N [id number]   EXP 06-12-2025   LOT [id number]

## 2024-02-01 NOTE — Progress Notes (Unsigned)
 Name: Monica Curtis MRN: [id number] DOB: 1961/03/10    CHIEF COMPLAINT:  Follow-up assessment for sleep apnea  HISTORY OF PRESENT ILLNESS: Patient is here for follow-up sleep apnea assessment HST showed severe sleep apnea AHI of 40 Discussed sleep data and reviewed with patient.  Encouraged proper weight management.  Discussed driving precautions and its relationship with hypersomnolence.  Discussed sleep hygiene, and benefits of a fixed sleep waked time.  The importance of getting eight or more hours of sleep discussed with patient.  Discussed limiting the use of the computer and television before bedtime.  Decrease naps during the day, so night time sleep will become enhanced.  Limit caffeine, and sleep deprivation.   Patient uses and benefits from therapy Using CPAP nightly and with naps Pressure setting is comfortable and is sleeping well.  No exacerbation at this time No evidence of heart failure at this time No evidence or signs of infection at this time No respiratory distress No fevers, chills, nausea, vomiting, diarrhea No evidence of lower extremity edema No evidence hemoptysis  Previous and current situation Works as a Conservation officer, nature at [employer] long naps due to excessive daytime sleepiness at the end of the day Non-smoker Nonalcoholic    PAST MEDICAL HISTORY :   has a past medical history of Depression, Diabetes mellitus without complication (HCC), Hyperlipidemia, and Hypertension.  has a past surgical history that includes Abdominal hysterectomy; Colonoscopy; Esophagogastroduodenoscopy; Cesarean section; Colonoscopy with propofol  (N/A, 01/16/2018); and Colonoscopy with propofol  (N/A, 12/23/2020). Prior to Admission medications   Medication Sig Start Date End Date Taking? Authorizing Provider  buPROPion  (WELLBUTRIN  XL) 300 MG 24 hr tablet Take 1 tablet (300 mg total) by mouth daily. 07/26/22 09/24/22  Vickey Mettle, MD  cabergoline (DOSTINEX) 0.5 MG tablet Take  0.5 mg by mouth 3 (three) times a week.    [provider]  cetirizine (ZYRTEC) 10 MG tablet Take 10 mg by mouth daily.    [provider]  clonazePAM  (KLONOPIN ) 0.25 MG disintegrating tablet Take 1 tablet (0.25 mg total) by mouth daily as needed (anxiety). 07/29/22 09/27/22  Vickey Mettle, MD  lamoTRIgine  (LAMICTAL ) 200 MG tablet Take 2 tablets (400 mg total) by mouth daily. 08/17/22 11/15/22  Hisada, Reina, MD  metroNIDAZOLE (METROCREAM) 0.75 % cream Apply topically. 04/19/22   [provider]  paliperidone  (INVEGA  SUSTENNA) 234 MG/1.5ML injection Inject 234 mg into the muscle every 28 (twenty-eight) days for 6 doses. 07/07/22 11/25/22  Vickey Mettle, MD  sertraline (ZOLOFT) 100 MG tablet Take 100 mg by mouth 2 (two) times daily.    [provider]  simvastatin (ZOCOR) 40 MG tablet Take 40 mg by mouth at bedtime. 03/16/22   [provider]   Allergies  Allergen Reactions   Misc. Sulfonamide Containing Compounds Hives   Sulfa Antibiotics Hives    FAMILY HISTORY:  family history includes Alcohol abuse in her father; Breast cancer in her paternal grandmother and sister; Dementia in her maternal aunt; Depression in her sister; Drug abuse in her father. SOCIAL HISTORY:  reports that she has never smoked. She has never used smokeless tobacco. She reports that she does not currently use alcohol. She reports that she does not use drugs.   BP 126/80   Pulse 76   Temp 99.1 F (37.3 C)   Ht 5' 4.5 (1.638 m)   Wt 168 lb 3.2 oz (76.3 kg)   SpO2 96%   BMI 28.43 kg/m      Review of Systems: Gen:  Denies  fever, sweats, chills weight loss  HEENT: Denies blurred vision, double vision, ear pain, eye pain, hearing loss, nose bleeds, sore throat Cardiac:  No dizziness, chest pain or heaviness, chest tightness,edema, No JVD Resp:   No cough, -sputum production, -shortness of breath,-wheezing, -hemoptysis,  Other:  All other systems negative   Physical  Examination:   General Appearance: No distress  EYES PERRLA, EOM intact.   NECK Supple, No JVD Pulmonary: normal breath sounds, No wheezing.  CardiovascularNormal S1,S2.  No m/r/g.   Abdomen: Benign, Soft, non-tender. Neurology UE/LE 5/5 strength, no focal deficits Ext pulses intact, cap refill intact ALL OTHER ROS ARE NEGATIVE    ASSESSMENT AND PLAN SYNOPSIS  63 year old pleasant white female seen today for follow-up assessment for severe sleep apnea with AHI at 40  Assessment & Plan OSA (obstructive sleep apnea)  Assessment of OSA Previous AHI 40 Continue CPAP as prescribed  Excellent compliance report Reviewed compliance report in detail with patient Patient definitely benefits the use of CPAP therapy as prescribed Using CPAP nightly and with naps Pressure setting is comfortable and is sleeping well. CPAP prescription 5-10 AHI reduced to 6  No evidence of acute heart failure at this time No respiratory distress No fevers, chills, nausea, vomiting, diarrhea No evidence hemoptysis  Patient Instructions Continue to use CPAP every night, minimum of 4-6 hours a night.  Change equipment every 30 days or as directed by DME.  Wash your tubing with warm soap and water daily, hang to dry. Wash humidifier portion weekly. Use bottled, distilled water and change daily   Be aware of reduced alertness and do not drive or operate heavy machinery if experiencing this or drowsiness.  Exercise encouraged, as tolerated. Encouraged proper weight management.  Important to get eight or more hours of sleep  Limiting the use of the computer and television before bedtime.  Decrease naps during the day, so night time sleep will become enhanced.  Limit caffeine, and sleep deprivation.  HTN, stroke, uncontrolled diabetes and heart failure are potential risk factors.  Risk of untreated sleep apnea including cardiac arrhthymias, stroke, DM, pulm HTN.   Parkinsonism, unspecified Parkinsonism  type (HCC) Stable at this time Shaking Stable at this time  Obesity -recommend significant weight loss -recommend changing diet  Deconditioned state -Recommend increased daily activity and exercise    MEDICATION ADJUSTMENTS/LABS AND TESTS ORDERED: Continue CPAP as prescribed Avoid Allergens and Irritants Avoid secondhand smoke Avoid SICK contacts Recommend  Masking  when appropriate Recommend Keep up-to-date with vaccinations Recommend weight loss  MEDICATION ADJUSTMENTS/LABS AND TESTS ORDERED:    CURRENT MEDICATIONS REVIEWED AT LENGTH WITH PATIENT TODAY   Patient  satisfied with Plan of action and management. All questions answered   Follow up 6 months   I spent a total of 42 minutes dedicated to the care of this patient on the date of this encounter to include pre-visit review of records, face-to-face time with the patient discussing conditions above, post visit ordering of testing, clinical documentation with the electronic health record, making appropriate referrals as documented, and communicating necessary information to the patient's healthcare team.    The Patient requires high complexity decision making for assessment and support, frequent evaluation and titration of therapies, application of advanced monitoring technologies and extensive interpretation of multiple databases.  Patient satisfied with Plan of action and management. All questions answered    Nickolas Alm Cellar, M.D.  [facility]  Medical Director [facility] Medical Director [facility] Cardio-Pulmonary  Department

## 2024-02-02 ENCOUNTER — Telehealth: Payer: Self-pay | Admitting: Internal Medicine

## 2024-02-02 NOTE — Telephone Encounter (Signed)
 Message sent through the referral To: Nickolas Cellar, MD   Hello   I see this patient has been to [facility] in [place] in the past. Does she not want to return there? We are not seeing any notes on the two DX you sent over.   Thanks Danna

## 2024-02-05 NOTE — Telephone Encounter (Signed)
 I have left a message for the patient to call to see if she wants to continue seeing [facility] or go to [facility]

## 2024-02-08 NOTE — Telephone Encounter (Signed)
 I spoke with Monica Curtis about the Neurology referral. She didn't understand why Dr. Isaiah placed the order. She would like to be seen by [facility] not in [place]. The referral has been faxed to [facility]

## 2024-02-10 ENCOUNTER — Other Ambulatory Visit: Payer: Self-pay | Admitting: Psychiatry
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
## 2024-02-18 IMAGING — MG MM DIGITAL SCREENING BILAT W/ TOMO AND CAD
6 of 10 series · 6 of 30 positions shown · non-contrast
Comparison: Previous exam(s).

CLINICAL DATA: Screening.

EXAM:
DIGITAL SCREENING BILATERAL MAMMOGRAM WITH TOMOSYNTHESIS AND CAD
TECHNIQUE: Bilateral screening digital craniocaudal and mediolateral oblique
mammograms were obtained. Bilateral screening digital breast
tomosynthesis was performed. The images were evaluated with
computer-aided detection.

[L MLO synth-2D (1 of 2)]
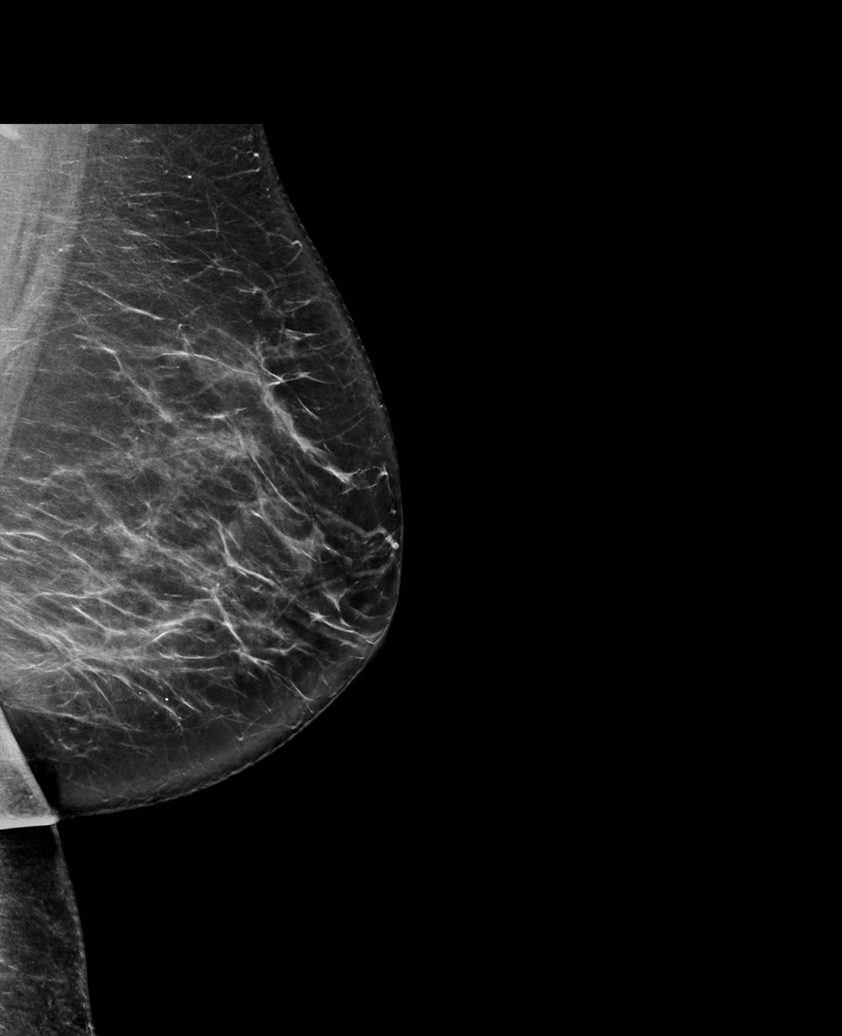

[R CC synth-2D]
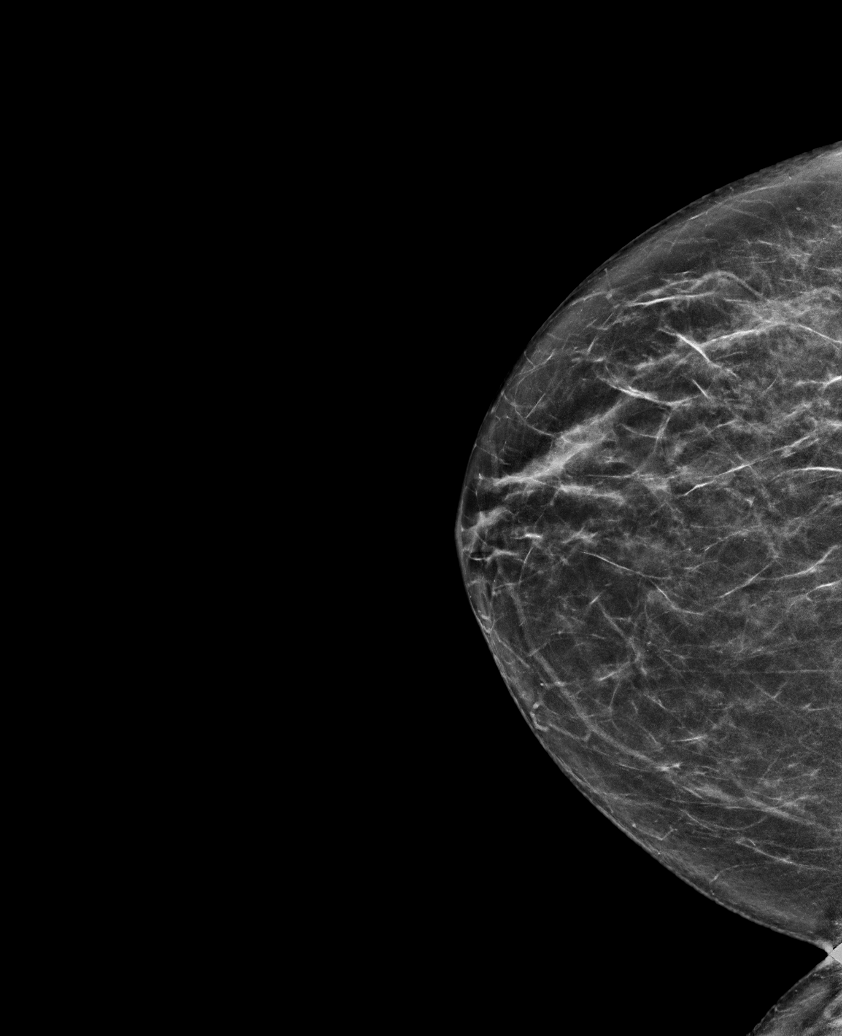

[L MLO synth-2D (2 of 2)]
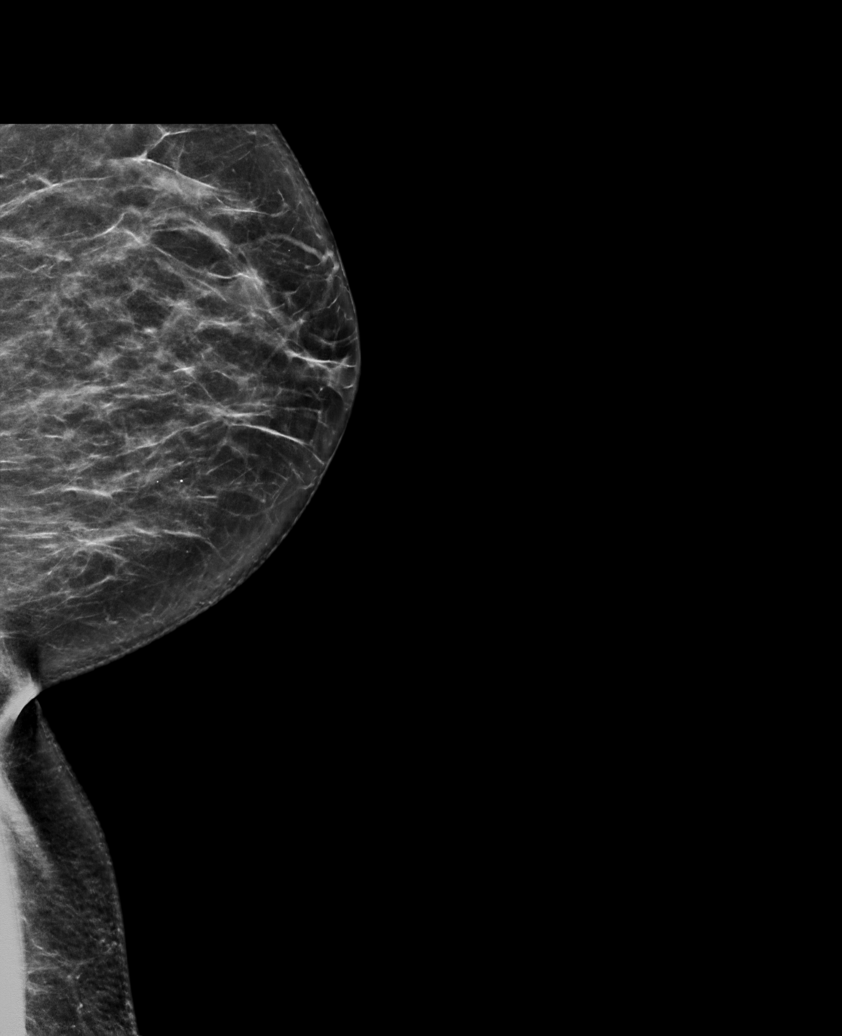

[L CC synth-2D]
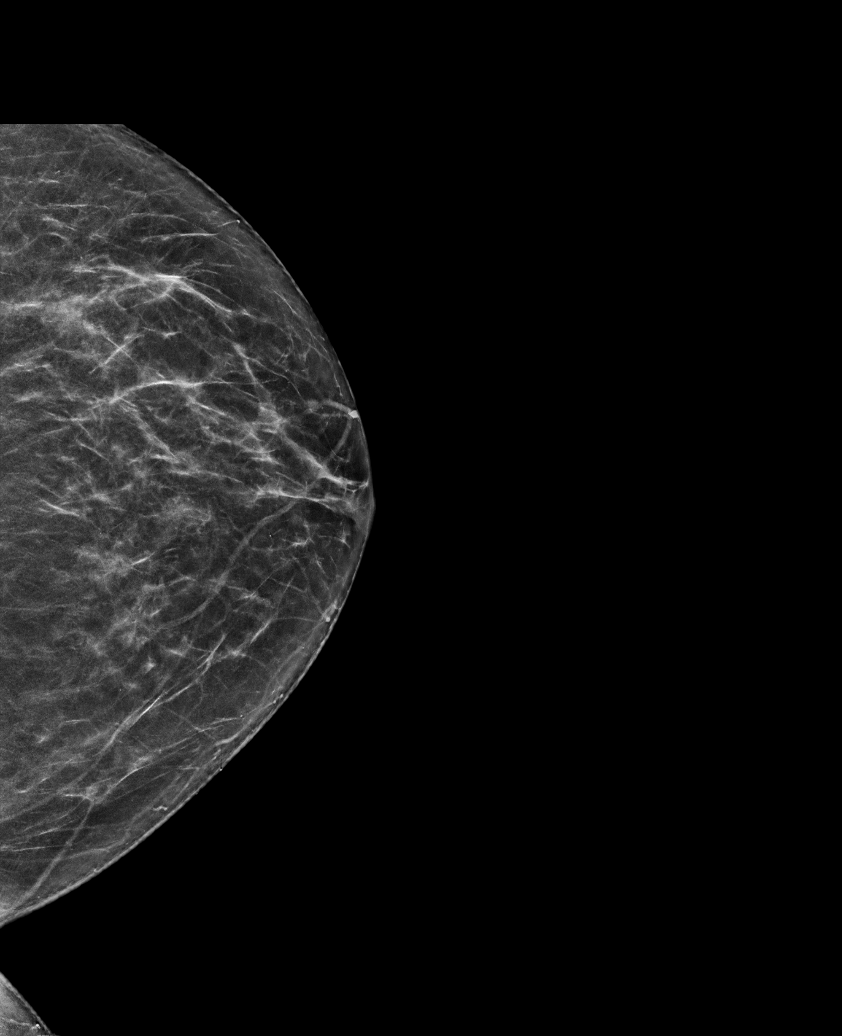

[R MLO synth-2D]
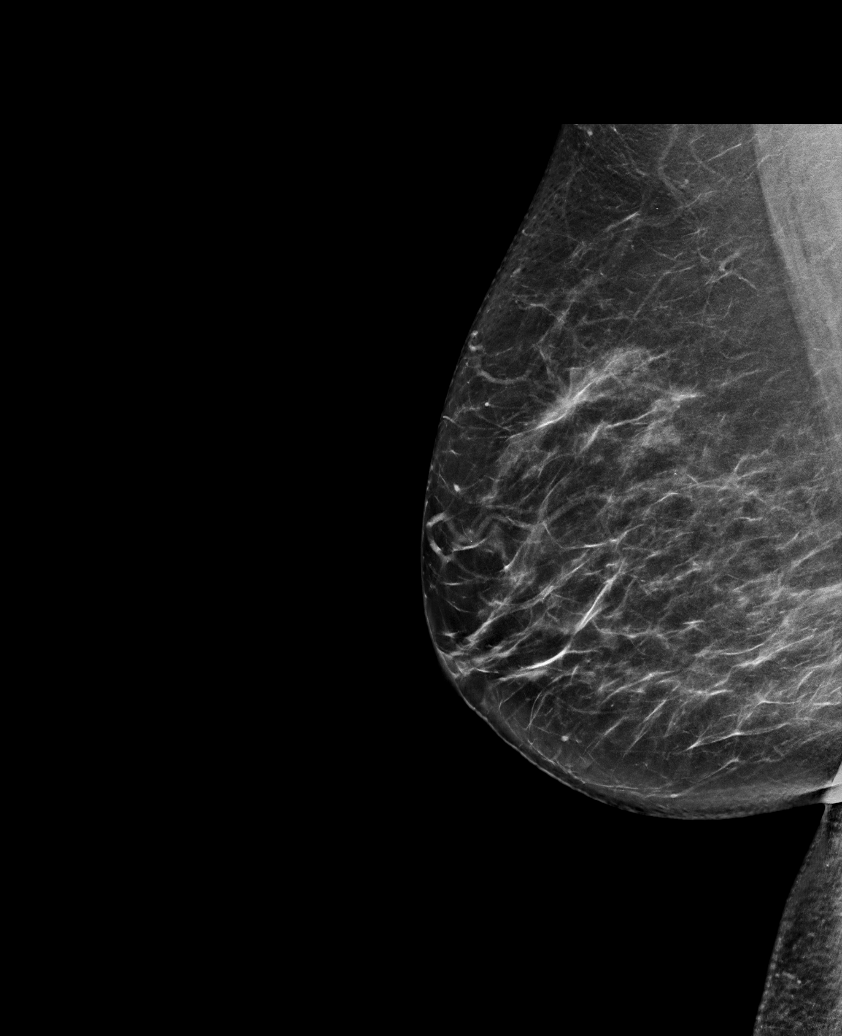

[R MLO tomo · tomo slice 39/76.0]
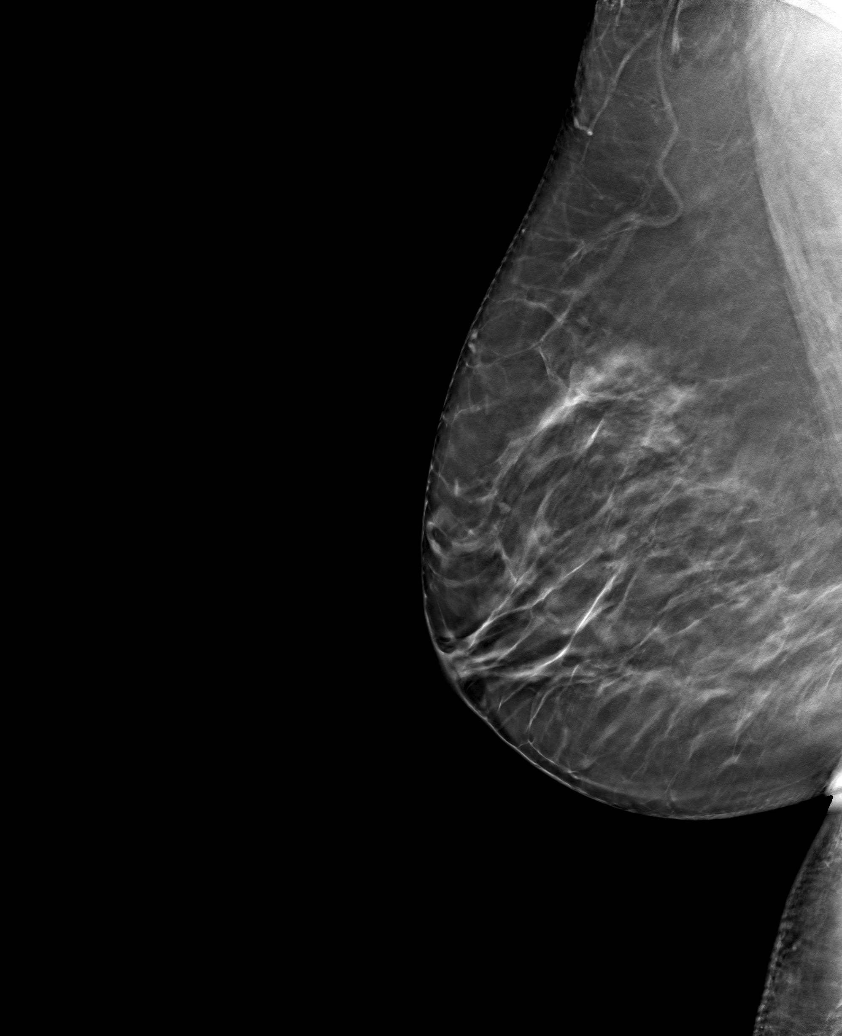

[6 of 30 positions shown; findings below may reference images not displayed]

ACR Breast Density Category b: There are scattered areas of
fibroglandular density.
FINDINGS: There are no findings suspicious for malignancy.
IMPRESSION: No mammographic evidence of malignancy. A result letter of this
screening mammogram will be mailed directly to the patient.

RECOMMENDATION:
Screening mammogram in one year. (Code:51-O-LD2)

BI-RADS CATEGORY  1: Negative.

## 2024-02-19 ENCOUNTER — Other Ambulatory Visit: Payer: Self-pay

## 2024-02-19 ENCOUNTER — Ambulatory Visit (INDEPENDENT_AMBULATORY_CARE_PROVIDER_SITE_OTHER): Admitting: Psychiatry

## 2024-02-19 ENCOUNTER — Encounter: Payer: Self-pay | Admitting: Psychiatry

## 2024-02-19 VITALS — BP 148/82 | HR 73 | Temp 98.2°F | Ht 64.5 in | Wt 169.6 lb

## 2024-02-19 DIAGNOSIS — F411 Generalized anxiety disorder: Secondary | ICD-10-CM

## 2024-02-19 DIAGNOSIS — F251 Schizoaffective disorder, depressive type: Secondary | ICD-10-CM

## 2024-02-19 DIAGNOSIS — E559 Vitamin D deficiency, unspecified: Secondary | ICD-10-CM

## 2024-02-19 DIAGNOSIS — G2401 Drug induced subacute dyskinesia: Secondary | ICD-10-CM | POA: Diagnosis not present

## 2024-02-19 DIAGNOSIS — Z79899 Other long term (current) drug therapy: Secondary | ICD-10-CM

## 2024-02-19 DIAGNOSIS — R4189 Other symptoms and signs involving cognitive functions and awareness: Secondary | ICD-10-CM

## 2024-02-19 MED ORDER — VILAZODONE HCL 10 MG PO TABS: ORAL_TABLET | ORAL | 1 refills | Status: DC

## 2024-02-19 MED ORDER — VENLAFAXINE HCL ER 75 MG PO CP24: 75.0000 mg | ORAL_CAPSULE | Freq: Every day | ORAL | 0 refills | Status: DC

## 2024-02-19 NOTE — Patient Instructions (Signed)
 Continue Invega  Sustenna 234 mg IM q28 day  Continue bupropion  450 mg daily  Continue lamotrigine  400 mg daily  Decrease venlafaxine  75 mg daily for one week, then discontinue Start viibryd  5 mg daily for one week, then 10 mg daily  Continue clonazepam  0.25 mg ODT at night  Next appointment:  11/4 at 4:30

## 2024-02-29 ENCOUNTER — Ambulatory Visit

## 2024-03-06 ENCOUNTER — Other Ambulatory Visit: Payer: Self-pay

## 2024-03-06 ENCOUNTER — Ambulatory Visit

## 2024-03-06 VITALS — BP 142/81 | HR 88 | Temp 98.0°F | Ht 64.5 in | Wt 169.4 lb

## 2024-03-06 DIAGNOSIS — F251 Schizoaffective disorder, depressive type: Secondary | ICD-10-CM | POA: Diagnosis not present

## 2024-03-06 DIAGNOSIS — F32A Depression, unspecified: Secondary | ICD-10-CM | POA: Diagnosis not present

## 2024-03-06 NOTE — Patient Instructions (Signed)
 Patient present flat affect mood was pleasant and denied visual or auditory hallucinations. No suicidal or homicidal ideations, no plan, intent, or means to want to harm self or others. Patients Invega  Sustenna 234 mg/1.5 ml IM injection prepared as ordered and administered to patient in her right deltoid. Patient tolerated without discomfort or pain. Patient will return in 28 days and will call if there is any changes.  NDC 49541-435-98 GTIN [id number] EXP 06-12-2025 LOT [id number]

## 2024-03-06 NOTE — Progress Notes (Unsigned)
 Patient present flat affect mood was pleasant and denied visual or auditory hallucinations. No suicidal or homicidal ideations, no plan, intent, or means to want to harm self or others. Patients Invega  Sustenna 234 mg 1.5 ml IM injection prepared as ordered and administered to patient in her right deltoid. Patient tolerated without discomfort or pain. Patient will return in 3 weeks and will call if there is any changes.  NDC 49541-435-98 GTIN [id number] EXP 06-12-25 LOT [id number]

## 2024-03-07 ENCOUNTER — Telehealth: Payer: Self-pay | Admitting: Psychiatry
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
## 2024-03-07 NOTE — Telephone Encounter (Signed)
 Contacted the patient. Details in the note.

## 2024-03-07 NOTE — Telephone Encounter (Signed)
 We made a referral to Dr. Maree at [facility] for memory loss in July 2025. Could you check on the current status of the referral? They have not heard back from anybody. Please let them know that because of the patient's memory loss, her son, Donnice can be contacted to make that appointment. She agreed with this. Would like an urgent referral due to significant worsening in her symptoms.

## 2024-03-08 ENCOUNTER — Encounter: Payer: Self-pay | Admitting: Psychiatry

## 2024-03-08 ENCOUNTER — Telehealth: Payer: Self-pay | Admitting: Psychiatry

## 2024-03-08 NOTE — Telephone Encounter (Signed)
 Called her son, Donnice to discuss concerns related to driving, and update on neurology referral. Left voice message that this writer will send a my chart message so that he can check with her. He was advised to contact the office if any concerns or questions.

## 2024-03-08 NOTE — Telephone Encounter (Signed)
 called referring provider office the referral was closed. I had to reopen the referral and the referral person stated that the reason it was closed was due to pt insurance that with the type of insurance pt has that referral needs to come through the pcp.  She also stated that the [facility] had also sent a referral which they have to close because again with pt insurance referral will need to come from pcp.   Dr. Hisada was notified   I did call pt pcp and spoke with Sharlet.  She stated that pcp does not work on Thursday and Fridays and so a message was left for pcp and for his nurse.

## 2024-03-08 NOTE — Telephone Encounter (Signed)
 Talked with the patient.  She does recall our conversation yesterday about not to drive. She agrees to reach out to her primary care provider next Mon to get referred to Dr. Maree, neurology.   She expressed understanding not to drive at least until she is seen by neurology.  She also expressed understanding and agreed that we communicate with her son, Donnice about her treatment plans/recommendation.

## 2024-03-08 NOTE — Telephone Encounter (Signed)
 pt son called states that he was returning the provider phone call. he states that as far as he knows that they were still trying to get the mychart to work. he wanted to know if you could call him

## 2024-03-08 NOTE — Telephone Encounter (Addendum)
 Called his phone twice. Left a voice message to contact the office next Monday.   When he calls back, please advise the following - Please refrain from driving for safety reasons as we discussed at her last visit. - We double-checked the neurology referral. Unfortunately, they only accept referrals from your primary care provider. We understand that Dr. Jacqulyn referral was declined for this reason. We have reached out to your primary care to request the referral, and it would be helpful if they could follow up with them directly.

## 2024-03-11 ENCOUNTER — Telehealth: Payer: Self-pay | Admitting: Psychiatry

## 2024-03-11 ENCOUNTER — Other Ambulatory Visit: Payer: Self-pay | Admitting: Psychiatry

## 2024-03-11 NOTE — Telephone Encounter (Signed)
 Mailing patient ROI to talk to son. She will contact pcp regarding referral to neurology. She has also asked if you would be able to fill out FMLA papers since she is having memory difficulties and to save her job. Please advise.

## 2024-03-11 NOTE — Telephone Encounter (Signed)
 Please advise her to have the paperwork sent to us . Will try to support this until she is seen by her neurologist.

## 2024-03-20 ENCOUNTER — Ambulatory Visit: Admitting: Professional Counselor

## 2024-03-20 NOTE — Progress Notes (Signed)
 Today the history is gathered from: 50% - patient  50% - patients son  RECORDS SUMMARY: I have reviewed the note dated 03/08/2024 from Alm Na, MD who has indicated:  Cognitive disorder  Given these abnormal neurologic findings, a referral to neurology has been recommended.  REFERRING PHYSICIAN: Na Alm Hail* PRIMARY CARE PHYSICIAN:  Na Alm Hail, MD   IMPRESSION/PLAN  Monica Curtis is a 63 y.o. female presenting for evaluation of  COGNITIVE CHANGE/ HX OF SCHIZOAFFECTIVE DISORDER/ IMBALANCE/ TREMORS  - New to me. - Patient last seen by clinic in 2021 for dizziness, drug-induced Parkinsonism and schizoaffective disorder. Here today for evaluation of cognitive change. Did see ER on 11/16/2023 for fall with left side head injury with dizziness and nausea.  - Symptoms are most consistent with mildly reduced short term memory loss.  - Memory evaluation today was 27/30. Will repeat in 6 months.  - Recommend consulting with psychiatrist to discuss adjusting INVEGA  SUSTENNA dosage.  - Recommend completing balance exercises daily for 5 minutes to reduce fall risk, increase muscle strength and improve balance. - Demonstrated balance exercises in clinic today. - Instructions: Complete balance exercises daily for 5 minutes. Stand at a level and steady countertop to reduce fall risk, if unsteady. Place left foot in front of right foot. Next, lift up right hand and count to 10. Then place right hand back on the countertop. Then, place right foot in front of left foot. Next, lift up left hand and count to 10. Place left hand back on the countertop. Continue alternating for 20 times on each side, then stop. This totals to 5 minutes.  - Could consider checking B12 and TSH levels at future visit.  - No memory medications needed at this time, will consider if symptoms worsen.  - Discussed not driving due to memory concerns.   Medications previously tried:  Follow-up with Dr. Lane  in 3 months  p=4   CHIEF COMPLAINT & HPI  Monica Curtis is a 63 y.o. female presenting for evaluation of: Chief Complaint  Patient presents with  . COGNITIVE CHANGE/ HX OF SCHIZOAFFECTIVE DISORDER   COGNITIVE CHANGE/ HX OF SCHIZOAFFECTIVE DISORDER/ IMBALANCE/ TREMORS  Patient last seen by clinic in 2021 for dizziness, drug-induced Parkinsonism and schizoaffective disorder. Here today for evaluation of cognitive change. Did see ER on 11/16/2023 for fall with left side head injury with dizziness and nausea. Has imbalance and has had numerous falls. Ongoing tremors- has difficulty with ADLs. Endorses tremors about body. Per son, patient has a family history of Dementia and Alzheimer's on maternal side. Memory changes became concerning 5-6 years ago. Repeats questions and conversations. Has difficulty recalling familiar names, faces, and locations. Misplaces items around her home more frequent than baseline. Denies visual and auditory hallucinations. Has difficulty driving- often forgets why she is driving and gets lost. Endorses aggravation and mood fluctuation. Patient manages her own finances and medications. Has difficulty remembering and taking medications. Patient lives alone. Memory evaluation today was 27/30.   DATA SUMMARY: 11/16/2023 CT HEAD WO IMPRESSION:  1. Stable head CT, no acute intracranial process.   07/05/2023 CT HEAD WO IMPRESSION:  Unremarkable CT appearance of the brain.   04/20/2020 MR BRAIN/IAC W WO IMPRESSION:  1. Normal appearance of the brain itself. No abnormality seen to  explain the presenting symptoms.  2. Some layering fluid in the left division of the sphenoid sinus  and some opacified posterior ethmoid air cells on the left.   11/16/2016 MR BRAIN W WO  IMPRESSION:  Indeterminate, mild enhancement of the bilateral trigeminal nerves, left greater than right, unchanged compared to prior MRI from 01/28/2015.   12/10/2015 MR CERVICAL SPINE WO IMPRESSION:  1. 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  2. Patent canal and left-sided foramina.   01/28/2015 MR BRAIN W WO IMPRESSION:  Normal pre- and post-contrast MRI of the pituitary gland and brain.    VISIT SUMMARIES:   MEDICATIONS Current Outpatient Medications  Medication Sig Dispense Refill  . clonazePAM  (KLONOPIN ) 0.25 MG disintegrating tablet Take 0.25 mg by mouth 2 (two) times daily 1.25 takes a day    . INVEGA  SUSTENNA 234 mg/1.5 mL IM syringe INJECT 1.5 MLS (234 MG TOTAL) INTO THE MUSCLE MONTHLY 1.5 mL 2  . lamoTRIgine  (LAMICTAL ) 200 MG tablet Take 2 tablets (400 mg total) by mouth once daily 30 tablet 1  . meclizine (ANTIVERT) 25 mg tablet TAKE 1 TABLET (25 MG TOTAL) BY MOUTH 3 (THREE) TIMES DAILY AS NEEDED FOR DIZZINESS 90 tablet 1  . sertraline (ZOLOFT) 100 MG tablet Take 100 mg by mouth 2 (two) times daily    . simvastatin (ZOCOR) 40 MG tablet Take 1 tablet (40 mg total) by mouth at bedtime 90 tablet 3  . buPROPion  (WELLBUTRIN  XL) 150 MG XL tablet Take 150 mg by mouth Together with 300 mg pill    . buPROPion  (WELLBUTRIN  XL) 300 MG XL tablet Take 1 tablet by mouth once daily Taking 300 mg and 150 mg for a total of 450 mg. (Patient not taking: Reported on 12/21/2023)    . cabergoline (DOSTINEX) 0.5 mg tablet Take 1 tablet (0.5 mg total) by mouth twice a week Tuesday and Friday (Patient not taking: Reported on 03/20/2024) 24 tablet 3  . cetirizine (ZYRTEC) 10 MG tablet Take 10 mg by mouth once daily (Patient not taking: Reported on 03/20/2024)    . escitalopram  oxalate (LEXAPRO ) 20 MG tablet Take 20 mg by mouth once daily    . metroNIDAZOLE (METROCREAM) 0.75 % cream apply to affected area twice a day (Patient not taking: Reported on 03/20/2024) 45 g 1  . venlafaxine  (EFFEXOR -XR) 150 MG XR capsule Take 150 mg by mouth     No current facility-administered medications for this visit.    ALLERGIES Allergies  Allergen Reactions  . Sulfa (Sulfonamide Antibiotics) Hives      EXAM   Vitals:   03/20/24 0818  Weight: 76.2 kg (168 lb)  Height: 162.6 cm (5' 4)  PainSc: 0-No pain   Body mass index is 28.84 kg/m.  MEMORY EVALUATION: 03/20/2024 - 27/30  GENERAL: Pleasant female.  NAD.  Normocephalic and atraumatic.  MUSCULOSKELETAL: Bulk - Normal Tone - Normal Pronator Drift - Absent bilaterally. Ambulation - Gait and station are moderately unsteady. Romberg - positive.  R/L 5/5    Shoulder abduction (deltoid/supraspinatus, axillary/suprascapular n, C5) 5/5    Elbow flexion (biceps brachii, musculoskeletal n, C5-6) 5/5    Elbow extension (triceps, radial n, C7) 5/5    Finger adduction (interossei, ulnar n, T1)  5/5    Hip flexion (iliopsoas, L1/L2) 5/5    Knee flexion (hamstrings, sciatic n, L5/S1)  5/5    Knee extension (quadriceps, femoral n, L3/4) 5/5    Ankle dorsiflexion (tibialis anterior, deep fibular n, L4/5) 5/5    Ankle plantarflexion (gastroc, tibial n, S1)   NEUROLOGICAL: MENTAL STATUS: Patient is oriented to person, place and time.  Recent memory is intact.  Remote memory is intact.  Attention span and concentration are  intact.  Naming, repetition, comprehension and expressive speech are within normal limits.  Patient's fund of knowledge is within normal limits for educational level.  CRANIAL NERVES: Normal    CN II (normal visual acuity and visual fields) Normal    CN III, IV, VI (extraocular muscles are intact) Normal    CN V (facial sensation is intact bilaterally) Normal    CN VII (facial strength is intact bilaterally) Normal    CN VIII (hearing is intact bilaterally) Normal    CN IX/X (palate elevates midline, normal phonation) Normal    CN XI (shoulder shrug strength is normal and symmetric) Normal    CN XII (tongue protrudes midline)  SENSATION: Intact to pain and temp bilaterally (spinothalamic tracts) Intact to position and vibration bilaterally (dorsal columns)  REFLEXES: R/L 2+/2+    Biceps 2+/2+     Brachioradialis  2+/2+    Patellar 2+/2+    Achilles  COORDINATION/CEREBELLAR: Finger to nose testing is within normal limits.     PAST MEDICAL HISTORY Past Medical History:  Diagnosis Date  . BPPV (benign paroxysmal positional vertigo)   . Constipation   . Depression   . Diabetes mellitus type 2, uncomplicated (CMS/HHS-HCC)   . Headache, unspecified   . Hyperlipidemia   . Hyperprolactinemia (HHS-HCC)   . Hypertension   . Osteopenia   . Schizoaffective disorder (CMS/HHS-HCC)   . Tremor     PAST SURGICAL HISTORY Past Surgical History:  Procedure Laterality Date  . HYSTERECTOMY  1993  . COLONOSCOPY  01/16/2018   Tubular adenoma of the colon/Repeat 67yrs/MUS  . COLONOSCOPY  12/23/2020   internal hemorrhoids, normal colon. repeat 5 yrs/ TKT  . COLONOSCOPY  2000?   no polyps per patient  . UPPER GASTROINTESTINAL ENDOSCOPY      FAMILY HISTORY Family History  Problem Relation Name Age of Onset  . Alzheimer's disease Mother    . Myocardial Infarction (Heart attack) Father    . Heart disease Father    . Alcohol abuse Father    . Drug abuse Father    . Migraines Sister    . Breast cancer Sister    . Depression Sister    . High blood pressure (Hypertension) Maternal Grandfather    . Breast cancer Paternal Grandmother    . Diabetes Son    . Dementia Maternal Aunt      SOCIAL HISTORY  Social History   Tobacco Use  . Smoking status: Never    Passive exposure: Never  . Smokeless tobacco: Never  Vaping Use  . Vaping status: Never Used  Substance Use Topics  . Alcohol use: No    Alcohol/week: 0.0 standard drinks of alcohol  . Drug use: No     REVIEW OF SYSTEMS:  13 system ROS form was given to the patient to complete and I have reviewed it.  The form was sent for scan to the patient's EHR.  Pertinent positives and negatives are mentioned above in the HPI and all other systems are negative.   DATA  I have personally reviewed all of the data outlined below both  prior to the appointment and during the appointment with the patient as appropriate.  Ancillary Orders on 10/26/2023  Component Date Value Ref Range Status  . Hemoglobin A1C 10/26/2023 5.5  4.2 - 5.6 % Final  . Average Blood Glucose (Calc) 10/26/2023 111  mg/dL Final  . Prolactin - [facility] 10/26/2023 77.3 (H)  3.6 - 25.2 ng/mL Final  . WBC (White Blood Cell  Count) 10/26/2023 7.4  4.1 - 10.2 10^3/uL Final  . RBC (Red Blood Cell Count) 10/26/2023 4.19  4.04 - 5.48 10^6/uL Final  . Hemoglobin 10/26/2023 12.8  12.0 - 15.0 gm/dL Final  . Hematocrit 94/84/7974 39.1  35.0 - 47.0 % Final  . MCV (Mean Corpuscular Volume) 10/26/2023 93.3  80.0 - 100.0 fl Final  . MCH (Mean Corpuscular Hemoglobin) 10/26/2023 30.5  27.0 - 31.2 pg Final  . MCHC (Mean Corpuscular Hemoglobin * 10/26/2023 32.7  32.0 - 36.0 gm/dL Final  . Platelet Count 10/26/2023 212  150 - 450 10^3/uL Final  . RDW-CV (Red Cell Distribution Widt* 10/26/2023 13.0  11.6 - 14.8 % Final  . MPV (Mean Platelet Volume) 10/26/2023 9.6  9.4 - 12.4 fl Final  . Neutrophils 10/26/2023 4.95  1.50 - 7.80 10^3/uL Final  . Lymphocytes 10/26/2023 1.62  1.00 - 3.60 10^3/uL Final  . Monocytes 10/26/2023 0.53  0.00 - 1.50 10^3/uL Final  . Eosinophils 10/26/2023 0.25  0.00 - 0.55 10^3/uL Final  . Basophils 10/26/2023 0.03  0.00 - 0.09 10^3/uL Final  . Neutrophil % 10/26/2023 66.7  32.0 - 70.0 % Final  . Lymphocyte % 10/26/2023 21.9  10.0 - 50.0 % Final  . Monocyte % 10/26/2023 7.2  4.0 - 13.0 % Final  . Eosinophil % 10/26/2023 3.4  1.0 - 5.0 % Final  . Basophil% 10/26/2023 0.4  0.0 - 2.0 % Final  . Immature Granulocyte % 10/26/2023 0.4  <=0.7 % Final  . Immature Granulocyte Count 10/26/2023 0.03  <=0.06 10^3/L Final  . Cholesterol, Total 10/26/2023 177  100 - 200 mg/dL Final  . Triglyceride 94/84/7974 126  35 - 199 mg/dL Final  . HDL (High Density Lipoprotein) Cho* 10/26/2023 44.0  35.0 - 85.0 mg/dL Final  . LDL Calculated 10/26/2023 891  0 - 130 mg/dL  Final  . VLDL Cholesterol 10/26/2023 25  mg/dL Final  . Cholesterol/HDL Ratio 10/26/2023 4.0   Final  . Glucose 10/26/2023 101  70 - 110 mg/dL Final  . Sodium 94/84/7974 141  136 - 145 mmol/L Final  . Potassium 10/26/2023 4.2  3.6 - 5.1 mmol/L Final  . Chloride 10/26/2023 103  97 - 109 mmol/L Final  . Carbon Dioxide (CO2) 10/26/2023 31.9  22.0 - 32.0 mmol/L Final  . Urea Nitrogen (BUN) 10/26/2023 14  7 - 25 mg/dL Final  . Creatinine 94/84/7974 1.0  0.6 - 1.1 mg/dL Final  . Glomerular Filtration Rate (eGFR) 10/26/2023 64  >60 mL/min/1.73sq m Final  . Calcium 10/26/2023 9.4  8.7 - 10.3 mg/dL Final  . AST  94/84/7974 15  8 - 39 U/L Final  . ALT  10/26/2023 17  5 - 38 U/L Final  . Alk Phos (alkaline Phosphatase) 10/26/2023 92  34 - 104 U/L Final  . Albumin 10/26/2023 4.8  3.5 - 4.8 g/dL Final  . Bilirubin, Total 10/26/2023 0.5  0.3 - 1.2 mg/dL Final  . Protein, Total 10/26/2023 7.3  6.1 - 7.9 g/dL Final  . A/G Ratio 94/84/7974 1.9  1.0 - 5.0 gm/dL Final  . Vitamin D , 25-Hydroxy - [facility] 10/26/2023 18.0 (L)  30.0 - 100.0 ng/mL Final      No follow-ups on file.  Payor: [facility] MEDICARE ADVANTAGE PLAN / Plan: UHC DUAL COMPLETE HMO POS SNP / Product Type: HMO /  This note is partially written by Lauraine Hales, in the presence of and acting as the scribe of Dr. Arthea Farrow.     I have reviewed, edited and  added to the note as needed to reflect my best personal medical judgment.    Dr. Arthea Farrow, MD [facility] [place], [place] Ph:  [phone] Fax:  [phone]

## 2024-03-25 ENCOUNTER — Other Ambulatory Visit: Payer: Self-pay | Admitting: Psychiatry

## 2024-04-03 ENCOUNTER — Ambulatory Visit

## 2024-04-03 ENCOUNTER — Telehealth: Payer: Self-pay | Admitting: Psychiatry

## 2024-04-03 ENCOUNTER — Other Ambulatory Visit: Payer: Self-pay | Admitting: Psychiatry

## 2024-04-03 ENCOUNTER — Other Ambulatory Visit: Payer: Self-pay

## 2024-04-03 VITALS — BP 145/87 | HR 74 | Temp 97.8°F | Ht 64.5 in | Wt 169.8 lb

## 2024-04-03 DIAGNOSIS — F251 Schizoaffective disorder, depressive type: Secondary | ICD-10-CM

## 2024-04-03 MED ORDER — PALIPERIDONE PALMITATE ER 234 MG/1.5ML IM SUSY
234.0000 mg | PREFILLED_SYRINGE | INTRAMUSCULAR | Status: DC
  Administered 2024-04-03: 234 mg via INTRAMUSCULAR

## 2024-04-03 NOTE — Patient Instructions (Signed)
 Patient present flat affect mood was pleasant and denied visual or auditory hallucinations. No suicidal or homicidal ideations, no plan, intent, or means to want to harm self or others. Patients Invega  Sustenna 234 mg 1.5 ml IM injection prepared as ordered and administered to patient in her left deltoid. Patient tolerated without discomfort or pain. Patient will return in 28 days and will call if there is any changes.   NDC 49541-435-98 GTIN [id number] SN [id number] EXP 06-12-25 LOT [id number]

## 2024-04-03 NOTE — Progress Notes (Unsigned)
 Patient present flat affect mood was pleasant and denied visual or auditory hallucinations. No suicidal or homicidal ideations, no plan, intent, or means to want to harm self or others. Patients Invega  Sustenna 234 mg 1.5 ml IM injection prepared as ordered and administered to patient in her left deltoid. Patient tolerated without discomfort or pain. Patient will return in 28 days and will call if there is any changes.   NDC 49541-435-98 GTIN [id number] SN [id number] EXP 06-12-25 LOT [id number]

## 2024-04-04 NOTE — Telephone Encounter (Signed)
The form is completed.

## 2024-04-08 ENCOUNTER — Other Ambulatory Visit: Payer: Self-pay | Admitting: Psychiatry

## 2024-04-12 ENCOUNTER — Encounter: Payer: Self-pay | Admitting: Emergency Medicine

## 2024-04-12 ENCOUNTER — Other Ambulatory Visit: Payer: Self-pay

## 2024-04-12 ENCOUNTER — Emergency Department
Admission: EM | Admit: 2024-04-12 | Discharge: 2024-04-12 | Disposition: A | Discharge status: Home / Self Care | Attending: Emergency Medicine | Admitting: Emergency Medicine

## 2024-04-12 ENCOUNTER — Emergency Department

## 2024-04-12 DIAGNOSIS — I10 Essential (primary) hypertension: Secondary | ICD-10-CM | POA: Diagnosis not present

## 2024-04-12 DIAGNOSIS — R0600 Dyspnea, unspecified: Secondary | ICD-10-CM | POA: Insufficient documentation

## 2024-04-12 DIAGNOSIS — R0602 Shortness of breath: Secondary | ICD-10-CM | POA: Diagnosis present

## 2024-04-12 DIAGNOSIS — F419 Anxiety disorder, unspecified: Secondary | ICD-10-CM | POA: Insufficient documentation

## 2024-04-12 LAB — BASIC METABOLIC PANEL WITH GFR
Anion gap: 12 (ref 5–15)
BUN: 12 mg/dL (ref 8–23)
CO2: 26 mmol/L (ref 22–32)
Calcium: 9 mg/dL (ref 8.9–10.3)
Chloride: 101 mmol/L (ref 98–111)
Creatinine, Ser: 0.97 mg/dL (ref 0.44–1.00)
GFR, Estimated: >60 mL/min (ref >60)
Glucose, Bld: 136 mg/dL — ABNORMAL HIGH (ref 70–99)
Potassium: 3.3 mmol/L — ABNORMAL LOW (ref 3.5–5.1)
Sodium: 139 mmol/L (ref 135–145)

## 2024-04-12 LAB — CBC
HCT: 41.1 % (ref 36.0–46.0)
Hemoglobin: 13.6 g/dL (ref 12.0–15.0)
MCH: 30.2 pg (ref 26.0–34.0)
MCHC: 33.1 g/dL (ref 30.0–36.0)
MCV: 91.1 fL (ref 80.0–100.0)
Platelets: 247 K/uL (ref 150–400)
RBC: 4.51 MIL/uL (ref 3.87–5.11)
RDW: 12.5 % (ref 11.5–15.5)
WBC: 9.2 K/uL (ref 4.0–10.5)
nRBC: 0 % (ref 0.0–0.2)

## 2024-04-12 LAB — TROPONIN I (HIGH SENSITIVITY)
Troponin I (High Sensitivity): 6 ng/L (ref <18)
Troponin I (High Sensitivity): 6 ng/L (ref <18)

## 2024-04-12 NOTE — ED Provider Notes (Signed)
 [facility] Provider Note    Event Date/Time   First MD Initiated Contact with Patient 04/12/24 [phone]     (approximate)   History   Shortness of Breath   HPI Monica Curtis is a 63 y.o. female with medical history of depression and anxiety, schizoaffective disorder, hypertension, but no history of cardiac or pulmonary disease.  She presents for evaluation of acute shortness of breath.  She said that she recently quit her job and was looking into her various insurance options.  She says she has been a little bit anxious and worried about this and she was looking through all the information tonight when she started to get increasingly short of breath.  She says she has never had a panic attack before, at least of which she is aware, but she knowledges that this may have been what happened tonight.  After she got to the emergency department she started feeling better and she has not had any distress since that time, nearly 6 hours ago.  She has no chest pain nor abdominal pain.  No recent fever.  Mild cough.  No new medications recently.     Physical Exam   Triage Vital Signs: ED Triage Vitals  Encounter Vitals Group     BP 04/12/24 0047 (!) 170/154     Girls Systolic BP Percentile --      Girls Diastolic BP Percentile --      Boys Systolic BP Percentile --      Boys Diastolic BP Percentile --      Pulse Rate 04/12/24 0047 81     Resp 04/12/24 0047 18     Temp 04/12/24 0047 99.5 F (37.5 C)     Temp Source 04/12/24 0047 Oral     SpO2 04/12/24 0042 97 %     Weight 04/12/24 0048 76.7 kg (169 lb)     Height 04/12/24 0048 1.626 m (5' 4)     Head Circumference --      Peak Flow --      Pain Score 04/12/24 0048 0     Pain Loc --      Pain Education --      Exclude from Growth Chart --     Most recent vital signs: Vitals:   04/12/24 0226 04/12/24 0554  BP: (!) 169/98 (!) 175/87  Pulse: 81 76  Resp: 18 18  Temp: 98.2 F (36.8 C) 98.1 F (36.7 C)   SpO2: 98% 96%    General: Awake, no distress.  Patient is pleasant and conversant, acting normal and appropriate. CV:  Good peripheral perfusion.  Normal heart sounds, regular rate and rhythm. Resp:  Normal effort. Speaking easily and comfortably, no accessory muscle usage nor intercostal retractions.  Lungs are clear to auscultation. Abd:  No distention.    ED Results / Procedures / Treatments   Labs (all labs ordered are listed, but only abnormal results are displayed) Labs Reviewed  BASIC METABOLIC PANEL WITH GFR - Abnormal; Notable for the following components:      Result Value   Potassium 3.3 (*)    Glucose, Bld 136 (*)    All other components within normal limits  CBC  TROPONIN I (HIGH SENSITIVITY)  TROPONIN I (HIGH SENSITIVITY)     EKG  ED ECG REPORT I, Darleene Dome, the attending physician, personally viewed and interpreted this ECG.  Date: 04/12/2024 EKG Time: 00: 52 Rate: 83 Rhythm: normal sinus rhythm QRS Axis: normal Intervals: LVH,  otherwise unremarkable ST/T Wave abnormalities: Non-specific ST segment / T-wave changes, but no clear evidence of acute ischemia. Narrative Interpretation: no definitive evidence of acute ischemia; does not meet STEMI criteria.    RADIOLOGY I independently viewed and interpreted the patient's two-view chest x-ray and there is no evidence of pneumonia or pneumothorax or pulmonary edema.   PROCEDURES:  Critical Care performed: No  Procedures    IMPRESSION / MDM / ASSESSMENT AND PLAN / ED COURSE  I reviewed the triage vital signs and the nursing notes.                              Differential diagnosis includes, but is not limited to, anxiety/panic attack, pneumonia, allergic reaction, new onset CHF.  Patient's presentation is most consistent with acute presentation with potential threat to life or bodily function.  Labs/studies ordered: 2 view chest x-ray, EKG, BMP, CBC, high-sensitivity  troponin  Interventions/Medications given:  Medications - No data to display  (Note:  hospital course my include additional interventions and/or labs/studies not listed above.)   Patient is hypertensive but that is likely situational as well as her having a diagnosis of essential hypertension.  I will not alter her medications at this time.  Her evaluation is very reassuring with no evidence of an acute or emergent condition.  I provided reassurance and patient is comfortable with the plan for close follow-up.  She already has an appointment scheduled with her primary care doctor to discuss her blood pressure.  No indication for emergent intervention and I gave my usual and customary return precautions.         FINAL CLINICAL IMPRESSION(S) / ED DIAGNOSES   Final diagnoses:  Acute dyspnea  Anxiety     Rx / DC Orders   ED Discharge Orders     None        Note:  This document was prepared using Dragon voice recognition software and may include unintentional dictation errors.   Gordan Huxley, MD 04/12/24 [phone]

## 2024-04-12 NOTE — ED Triage Notes (Addendum)
 Pt arrives via EMS from home for c/o [facility]; to triage via w/c with no distress noted; pt denies any recent illness, denies pain; st hx anxiety

## 2024-04-12 NOTE — Discharge Instructions (Signed)
 Your workup in the Emergency Department today was reassuring.  We did not find any specific abnormalities.  We think that anxiety or even a panic attack may be contributing or have caused your symptoms.  We recommend you drink plenty of fluids, take your regular medications and/or any new ones prescribed today, and follow up with the doctor(s) listed in these documents as recommended.  Return to the Emergency Department if you develop new or worsening symptoms that concern you.
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
## 2024-04-16 ENCOUNTER — Other Ambulatory Visit: Payer: Self-pay

## 2024-04-16 ENCOUNTER — Ambulatory Visit (INDEPENDENT_AMBULATORY_CARE_PROVIDER_SITE_OTHER): Admitting: Psychiatry

## 2024-04-16 ENCOUNTER — Encounter: Payer: Self-pay | Admitting: Psychiatry

## 2024-04-16 VITALS — BP 159/92 | HR 83 | Temp 97.8°F | Ht 64.0 in | Wt 169.2 lb

## 2024-04-16 DIAGNOSIS — F251 Schizoaffective disorder, depressive type: Secondary | ICD-10-CM

## 2024-04-16 DIAGNOSIS — F411 Generalized anxiety disorder: Secondary | ICD-10-CM | POA: Diagnosis not present

## 2024-04-16 MED ORDER — CLONAZEPAM 0.25 MG PO TBDP: 0.2500 mg | ORAL_TABLET | Freq: Every evening | ORAL | 0 refills | Status: DC | PRN

## 2024-04-16 MED ORDER — BUPROPION HCL ER (XL) 300 MG PO TB24: 300.0000 mg | ORAL_TABLET | Freq: Every day | ORAL | 1 refills | Status: AC

## 2024-04-16 MED ORDER — ESCITALOPRAM OXALATE 20 MG PO TABS: ORAL_TABLET | ORAL | 1 refills | Status: DC

## 2024-04-16 NOTE — Patient Instructions (Signed)
 Continue Invega  Sustenna 234 mg IM q28 day  Continue bupropion  450 mg daily  Continue lamotrigine  400 mg daily  Decrease viibryd  5 mg daily for one week, then discontinue  Start lexapro  10 mg daily for one week, then 20 mg daily Continue clonazepam  0.25 mg ODT at night  Next appointment:  12/22 at 3 pm,

## 2024-04-17 NOTE — Progress Notes (Signed)
 This video encounter was conducted with the patient's (or proxy's) verbal consent via secure, interactive audio and video telecommunications while away from clinic/office/hospital.  The patient (or proxy) was instructed to have this encounter in a suitably private space and to only have persons present to whom they give permission to participate. In addition, patient identity was confirmed by use of name plus two identifiers.  This visit was coded based on medical decision making (MDM).   Chief Complaint:   Chief Complaint  Patient presents with  . Hospital Follow Up    Anxiety- patient saw psych yesterday and she changed her medications up- to start Effexor  and wean off Vilazodone - updated in meds list    Subjective:  Monica Curtis is a 63 y.o. female in today for virtual check.  She was in the ER  on 04/12/24 with dyspnea, that was worse when laying down.  Work up was negative for cardiac or other etiology and she was told she was having panic attacks.  BP was very high in the ER but she notes it usually runs >140-150's/80-90's.  She is multiple medications for depression  and anxiety already; also takes Clonazepam  0.25 mg nightly.  She continues to feel anxious and has difficulty getting a good breath, especially when laying down at night.  Past Medical History:  Diagnosis Date  . Allergy sulfa  . Anxiety 2010  . BPPV (benign paroxysmal positional vertigo)   . Chronic constipation 20 years  . Colon polyp 2020  . Constipation   . Depression   . Diabetes mellitus type 2, uncomplicated (CMS/HHS-HCC)   . Headache, unspecified   . History of cataract 2002  . Hyperlipidemia   . Hyperprolactinemia (HHS-HCC)   . Hypertension   . Osteopenia   . Schizoaffective disorder (CMS/HHS-HCC)   . Substance abuse (CMS-HCC)    teenager  . Tremor    Past Surgical History:  Procedure Laterality Date  . HYSTERECTOMY  1993  . CESAREAN SECTION  1993  . COLONOSCOPY  01/16/2018   Tubular  adenoma of the colon/Repeat 35yrs/MUS  . POLYPECTOMY  2020  . COLONOSCOPY  12/23/2020   internal hemorrhoids, normal colon. repeat 5 yrs/ TKT  . CATARACT EXTRACTION  ?  . COLONOSCOPY  2000?   no polyps per patient  . UPPER GASTROINTESTINAL ENDOSCOPY     Family History  Problem Relation Name Age of Onset  . Alzheimer's disease Mother Naomie Merles   . Myocardial Infarction (Heart attack) Father Renay Largo   . Heart disease Father Devory Mckinzie   . Alcohol abuse Father China Deitrick   . Drug abuse Father Laquanta Hummel   . Migraines Sister Katheryn Hempfling   . Breast cancer Sister Katheryn Hempfling   . Depression Sister Katheryn Hempfling   . High blood pressure (Hypertension) Maternal Grandfather Dempsey Nickels   . Dementia Maternal Grandfather Dempsey Nickels   . Breast cancer Paternal Grandmother    . Diabetes Son    . Dementia Maternal Aunt    . Obesity Son Donnice Monte    Social History   Socioeconomic History  . Marital status: Single  Tobacco Use  . Smoking status: Never    Passive exposure: Never  . Smokeless tobacco: Never  Vaping Use  . Vaping status: Never Used  Substance and Sexual Activity  . Alcohol use: No    Alcohol/week: 0.0 standard drinks of alcohol  . Drug use: No  . Sexual activity: Not Currently  Social History Narrative   Exercise:  None.   Diet:  Red meat 3 x a week.  Fast foods once a week.  Fried foods rarely.      Colonoscopy: 01/16/18   Social Drivers of Health   Financial Resource Strain: Low Risk  (03/20/2024)   Overall Financial Resource Strain (CARDIA)   . Difficulty of Paying Living Expenses: Not hard at all  Food Insecurity: No Food Insecurity (03/20/2024)   Hunger Vital Sign   . Worried About Programme Researcher, Broadcasting/film/video in the Last Year: Never true   . Ran Out of Food in the Last Year: Never true  Transportation Needs: No Transportation Needs (03/20/2024)   PRAPARE - Transportation   . Lack of Transportation (Medical): No   . Lack of Transportation  (Non-Medical): No  Housing Stability: Low Risk  (03/20/2024)   Housing Stability Vital Sign   . Unable to Pay for Housing in the Last Year: No   . Number of Times Moved in the Last Year: 0   . Homeless in the Last Year: No   Outpatient Medications Prior to Visit  Medication Sig Dispense Refill  . buPROPion  (WELLBUTRIN  XL) 150 MG XL tablet Take 150 mg by mouth Together with 300 mg pill    . buPROPion  (WELLBUTRIN  XL) 300 MG XL tablet Take 1 tablet by mouth once daily Taking 300 mg and 150 mg for a total of 450 mg.    . cetirizine (ZYRTEC) 10 MG tablet Take 10 mg by mouth once daily    . INVEGA  SUSTENNA 234 mg/1.5 mL IM syringe INJECT 1.5 MLS (234 MG TOTAL) INTO THE MUSCLE MONTHLY 1.5 mL 2  . lamoTRIgine  (LAMICTAL ) 200 MG tablet Take 2 tablets (400 mg total) by mouth once daily 30 tablet 1  . simvastatin (ZOCOR) 40 MG tablet Take 1 tablet (40 mg total) by mouth at bedtime 90 tablet 3  . vilazodone  (VIIBRYD ) 10 mg tablet Take 10 mg by mouth once daily (Patient taking differently: Take 10 mg by mouth once daily Is to decrease to 5mg  and wean off)    . cabergoline (DOSTINEX) 0.5 mg tablet Take 1 tablet (0.5 mg total) by mouth twice a week Tuesday and Friday (Patient not taking: Reported on 03/20/2024) 24 tablet 3  . clonazePAM  (KLONOPIN ) 0.25 MG disintegrating tablet Take 0.25 mg by mouth 2 (two) times daily 1.25 takes a day (Patient not taking: Reported on 04/17/2024)    . [START ON 05/10/2024] clonazePAM  (KLONOPIN ) 0.25 MG disintegrating tablet Take 0.25 mg by mouth at bedtime as needed At bedtime    . escitalopram  oxalate (LEXAPRO ) 20 MG tablet Take 20 mg by mouth once daily    . meclizine (ANTIVERT) 25 mg tablet TAKE 1 TABLET (25 MG TOTAL) BY MOUTH 3 (THREE) TIMES DAILY AS NEEDED FOR DIZZINESS (Patient not taking: Reported on 04/17/2024) 90 tablet 1  . metroNIDAZOLE (METROCREAM) 0.75 % cream apply to affected area twice a day (Patient not taking: Reported on 03/20/2024) 45 g 1  . sertraline (ZOLOFT)  100 MG tablet Take 100 mg by mouth 2 (two) times daily    . venlafaxine  (EFFEXOR -XR) 150 MG XR capsule Take 150 mg by mouth     No facility-administered medications prior to visit.   Allergies  Allergen Reactions  . Sulfa (Sulfonamide Antibiotics) Hives    Objective:   Vitals:   04/17/24 0939  Weight: 76.8 kg (169 lb 4.8 oz)  Height: 162.6 cm (5' 4)  PainSc: 0-No pain   Body mass index is 29.06 kg/m.  GENERAL:  Patient is alert and oriented, in no acute distress.    Assessment/Plan:  Anxiety/panic attacks.  Can increase clonazepam  to 2 tabs at night, or take an extra dose during the day if needed.  Continue on other Psych meds as previously.  She'll follow up with Psychiatry within the next week or two.  HTN, likely exacerbated by anxiety recently.  Start Propranolol ER 60 mg daily.  Monitor BP's and let me know readings in a couple weeks.  Will determine further follow up and management in the next few weeks.    Alm Na, MD, PhD

## 2024-05-01 ENCOUNTER — Telehealth: Payer: Self-pay

## 2024-05-01 ENCOUNTER — Ambulatory Visit

## 2024-05-01 ENCOUNTER — Other Ambulatory Visit: Payer: Self-pay

## 2024-05-01 ENCOUNTER — Emergency Department

## 2024-05-01 ENCOUNTER — Emergency Department
Admission: EM | Admit: 2024-05-01 | Discharge: 2024-05-01 | Disposition: A | Discharge status: Home / Self Care | Source: Ambulatory Visit | Attending: Emergency Medicine | Admitting: Emergency Medicine

## 2024-05-01 DIAGNOSIS — I10 Essential (primary) hypertension: Secondary | ICD-10-CM | POA: Insufficient documentation

## 2024-05-01 DIAGNOSIS — R001 Bradycardia, unspecified: Secondary | ICD-10-CM | POA: Insufficient documentation

## 2024-05-01 DIAGNOSIS — E119 Type 2 diabetes mellitus without complications: Secondary | ICD-10-CM | POA: Diagnosis not present

## 2024-05-01 LAB — BASIC METABOLIC PANEL WITH GFR
Anion gap: 10 (ref 5–15)
BUN: 14 mg/dL (ref 8–23)
CO2: 30 mmol/L (ref 22–32)
Calcium: 9.3 mg/dL (ref 8.9–10.3)
Chloride: 99 mmol/L (ref 98–111)
Creatinine, Ser: 1.13 mg/dL — ABNORMAL HIGH (ref 0.44–1.00)
GFR, Estimated: 55 mL/min — ABNORMAL LOW (ref >60)
Glucose, Bld: 121 mg/dL — ABNORMAL HIGH (ref 70–99)
Potassium: 4.7 mmol/L (ref 3.5–5.1)
Sodium: 139 mmol/L (ref 135–145)

## 2024-05-01 LAB — CBC
HCT: 40.5 % (ref 36.0–46.0)
Hemoglobin: 12.9 g/dL (ref 12.0–15.0)
MCH: 30 pg (ref 26.0–34.0)
MCHC: 31.9 g/dL (ref 30.0–36.0)
MCV: 94.2 fL (ref 80.0–100.0)
Platelets: 222 K/uL (ref 150–400)
RBC: 4.3 MIL/uL (ref 3.87–5.11)
RDW: 12.6 % (ref 11.5–15.5)
WBC: 8.2 K/uL (ref 4.0–10.5)
nRBC: 0 % (ref 0.0–0.2)

## 2024-05-01 LAB — TROPONIN T, HIGH SENSITIVITY
Troponin T High Sensitivity: <15 ng/L (ref 0–19)
Troponin T High Sensitivity: <15 ng/L (ref 0–19)

## 2024-05-01 NOTE — Telephone Encounter (Signed)
 Patient in today for Invega  Sustenna 234 mg/1.5 ml injection unable to receive injection today due to heart rate at 44 patient stated that she did not feel dizzy or lightheaded but did say she had some  light chest pain advised patient to go to the ER or Urgent Care she voiced understanding and stated that she would go to get check out     B/P 109/63

## 2024-05-01 NOTE — ED Provider Notes (Signed)
 Shared visit   Presents to the emergency department for bradycardia.  Patient is otherwise asymptomatic.  Sinus bradycardia on EKG.  Heart rate in the 50s.  Otherwise normotensive.  No significant electrolyte abnormality and troponin was undetectable.  Have a low suspicion for ACS or heart block.  Discussed discontinuing her propranolol and has a close follow-up appointment with her primary care physician.  Discussed return if became symptomatic.  No questions or concerns at time of discharge.   Suzanne Kirsch, MD 05/01/24 1718

## 2024-05-01 NOTE — ED Notes (Signed)
 Pt discharged to home, AVS and medications reviewed.  Pt verbalized understanding, has no questions at this time.

## 2024-05-01 NOTE — ED Triage Notes (Signed)
 Patient states she was at Psychiatry office to have Invega  shot; was told to come to ED for HR 44. Patient denies all symptoms; states she was started on a beta blocker recently.

## 2024-05-01 NOTE — ED Provider Notes (Signed)
 [facility] Provider Note    Event Date/Time   First MD Initiated Contact with Patient 05/01/24 1630     (approximate)   History   Bradycardia   HPI  Monica Curtis is a 63 y.o. female with PMH of hypertension, diabetes, depression, schizoaffective disorder presents for evaluation of bradycardia.  Patient was at her psychiatrist office today for an Invega  Sustenna injection when her heart rate was 44 so she was instructed to come to the emergency department.      Physical Exam   Triage Vital Signs: ED Triage Vitals [05/01/24 1148]  Encounter Vitals Group     BP 134/67     Girls Systolic BP Percentile      Girls Diastolic BP Percentile      Boys Systolic BP Percentile      Boys Diastolic BP Percentile      Pulse Rate (!) 50     Resp 18     Temp 98.5 F (36.9 C)     Temp Source Oral     SpO2 98 %     Weight      Height      Head Circumference      Peak Flow      Pain Score      Pain Loc      Pain Education      Exclude from Growth Chart     Most recent vital signs: Vitals:   05/01/24 1148 05/01/24 1632  BP: 134/67 136/63  Pulse: (!) 50 (!) 51  Resp: 18 18  Temp: 98.5 F (36.9 C) 98 F (36.7 C)  SpO2: 98% 99%    General: Awake, no distress.  CV:  Good peripheral perfusion.  RRR. Resp:  Normal effort.  CTAB Abd:  No distention.  Other:     ED Results / Procedures / Treatments   Labs (all labs ordered are listed, but only abnormal results are displayed) Labs Reviewed  BASIC METABOLIC PANEL WITH GFR - Abnormal; Notable for the following components:      Result Value   Glucose, Bld 121 (*)    Creatinine, Ser 1.13 (*)    GFR, Estimated 55 (*)    All other components within normal limits  CBC  TROPONIN T, HIGH SENSITIVITY  TROPONIN T, HIGH SENSITIVITY     EKG  ED provider interpretation: Sinus bradycardia  Vent. rate 47 BPM PR interval 148 ms QRS duration 86 ms QT/QTcB 468/414 ms P-R-T axes 27 26  58   RADIOLOGY  Chest x-ray obtained, I interpreted the images as well as reviewed the radiologist report, which was negative for any acute cardiopulmonary abnormalities.  PROCEDURES:  Critical Care performed: No  Procedures   MEDICATIONS ORDERED IN ED: Medications - No data to display   IMPRESSION / MDM / ASSESSMENT AND PLAN / ED COURSE  I reviewed the triage vital signs and the nursing notes.                             63 year old presents for evaluation of bradycardia.  Patient is bradycardic, vital signs stable otherwise.  Patient NAD on exam.  Differential diagnosis includes, but is not limited to, ACS, electrolyte abnormality, medication side effect, vasovagal.  Patient's presentation is most consistent with acute complicated illness / injury requiring diagnostic workup.  Physical exam overall reassuring, patient asymptomatic at this time.  Reviewed patient's chart, telemedicine visit on 11/5,  patient was started on propranolol ER 60 mg daily for treatment of hypertension likely exacerbated by anxiety.  CBC unremarkable, BMP shows mildly elevated creatinine, troponin not elevated.  Encourage patient to drink lots of water as I suspect her bump in creatinine is due to dehydration as she does not have previous history of kidney disease.  EKG shows sinus bradycardia.  Chest x-ray is negative.  Suspect the bradycardia is due to the propranolol.  Advised patient to stop taking this medication and follow-up with her primary care provider for further management of her blood pressure.  Patient voiced understanding, questions were answered she was stable at discharge.  Clinical Course as of 05/01/24 1718  Wed May 01, 2024  1652 DG Chest 2 View Negative. [LD]    Clinical Course User Index [LD] Cleaster Tinnie LABOR, PA-C     FINAL CLINICAL IMPRESSION(S) / ED DIAGNOSES   Final diagnoses:  Bradycardia     Rx / DC Orders   ED Discharge Orders     None         Note:  This document was prepared using Dragon voice recognition software and may include unintentional dictation errors.   Cleaster Tinnie LABOR, PA-C 05/01/24 1718    Suzanne Kirsch, MD 05/03/24 2357

## 2024-05-01 NOTE — Discharge Instructions (Signed)
 Your blood work was reassuring.  Your creatinine was a little bit elevated which is likely due to being dehydrated, please drink lots of water when you get home.  I believe you are low heart rate is due to the propranolol.  Please stop taking this medication and follow-up with your primary care provider.  Return to the emergency department with any worsening symptoms like development of chest pain, difficulty breathing, dizziness or passing out.

## 2024-05-02 ENCOUNTER — Telehealth: Payer: Self-pay | Admitting: Psychiatry

## 2024-05-02 NOTE — Telephone Encounter (Signed)
 Patient called back and said she spoke to her case worker for Medicaid and she should still be covered with insurance. Monica Curtis chose to cancel her 11/24 appt and kept her original December one.

## 2024-05-02 NOTE — Telephone Encounter (Signed)
 Patient called our office in tears stating she received a letter stating that she was going to lose her Medicaid insurance by the end of this month due to her not working. The letter states she has 90 days to appeal this. She is unable to work due to her health. She has an appointment to talk to you about this scheduled for Monday 11/24 at 4:30 and she will bring the letter.

## 2024-05-06 ENCOUNTER — Ambulatory Visit: Admitting: Psychiatry

## 2024-05-07 ENCOUNTER — Other Ambulatory Visit: Payer: Self-pay

## 2024-05-07 ENCOUNTER — Emergency Department

## 2024-05-07 ENCOUNTER — Observation Stay: Admission: EM | Admit: 2024-05-07 | Discharge: 2024-05-09 | Disposition: A | Discharge status: Home / Self Care

## 2024-05-07 ENCOUNTER — Encounter: Payer: Self-pay | Admitting: Emergency Medicine

## 2024-05-07 DIAGNOSIS — R9431 Abnormal electrocardiogram [ECG] [EKG]: Principal | ICD-10-CM | POA: Insufficient documentation

## 2024-05-07 DIAGNOSIS — E785 Hyperlipidemia, unspecified: Secondary | ICD-10-CM | POA: Insufficient documentation

## 2024-05-07 DIAGNOSIS — Z79899 Other long term (current) drug therapy: Secondary | ICD-10-CM | POA: Diagnosis not present

## 2024-05-07 DIAGNOSIS — R0602 Shortness of breath: Secondary | ICD-10-CM

## 2024-05-07 DIAGNOSIS — Z7901 Long term (current) use of anticoagulants: Secondary | ICD-10-CM | POA: Diagnosis not present

## 2024-05-07 DIAGNOSIS — I10 Essential (primary) hypertension: Secondary | ICD-10-CM | POA: Insufficient documentation

## 2024-05-07 DIAGNOSIS — E119 Type 2 diabetes mellitus without complications: Secondary | ICD-10-CM | POA: Diagnosis not present

## 2024-05-07 DIAGNOSIS — F419 Anxiety disorder, unspecified: Secondary | ICD-10-CM

## 2024-05-07 DIAGNOSIS — F410 Panic disorder [episodic paroxysmal anxiety] without agoraphobia: Secondary | ICD-10-CM | POA: Diagnosis present

## 2024-05-07 DIAGNOSIS — F259 Schizoaffective disorder, unspecified: Secondary | ICD-10-CM | POA: Insufficient documentation

## 2024-05-07 DIAGNOSIS — F32A Depression, unspecified: Secondary | ICD-10-CM | POA: Insufficient documentation

## 2024-05-07 DIAGNOSIS — Z8659 Personal history of other mental and behavioral disorders: Secondary | ICD-10-CM

## 2024-05-07 LAB — BASIC METABOLIC PANEL WITH GFR
Anion gap: 12 (ref 5–15)
BUN: 10 mg/dL (ref 8–23)
CO2: 26 mmol/L (ref 22–32)
Calcium: 9.2 mg/dL (ref 8.9–10.3)
Chloride: 100 mmol/L (ref 98–111)
Creatinine, Ser: 1.1 mg/dL — ABNORMAL HIGH (ref 0.44–1.00)
GFR, Estimated: 57 mL/min — ABNORMAL LOW (ref >60)
Glucose, Bld: 112 mg/dL — ABNORMAL HIGH (ref 70–99)
Potassium: 3.8 mmol/L (ref 3.5–5.1)
Sodium: 138 mmol/L (ref 135–145)

## 2024-05-07 LAB — HEMOGLOBIN A1C
Hgb A1c MFr Bld: 5.1 % (ref 4.8–5.6)
Mean Plasma Glucose: 99.67 mg/dL

## 2024-05-07 LAB — CBC
HCT: 38.9 % (ref 36.0–46.0)
HCT: 38.7 % (ref 36.0–46.0)
Hemoglobin: 12.6 g/dL (ref 12.0–15.0)
Hemoglobin: 12.8 g/dL (ref 12.0–15.0)
MCH: 30.2 pg (ref 26.0–34.0)
MCH: 30.6 pg (ref 26.0–34.0)
MCHC: 32.4 g/dL (ref 30.0–36.0)
MCHC: 33.1 g/dL (ref 30.0–36.0)
MCV: 93.3 fL (ref 80.0–100.0)
MCV: 92.6 fL (ref 80.0–100.0)
Platelets: 208 K/uL (ref 150–400)
Platelets: 203 K/uL (ref 150–400)
RBC: 4.17 MIL/uL (ref 3.87–5.11)
RBC: 4.18 MIL/uL (ref 3.87–5.11)
RDW: 12.5 % (ref 11.5–15.5)
RDW: 12.8 % (ref 11.5–15.5)
WBC: 8.9 K/uL (ref 4.0–10.5)
WBC: 8 K/uL (ref 4.0–10.5)
nRBC: 0 % (ref 0.0–0.2)
nRBC: 0 % (ref 0.0–0.2)

## 2024-05-07 LAB — MAGNESIUM: Magnesium: 2.3 mg/dL (ref 1.7–2.4)

## 2024-05-07 LAB — CREATININE, SERUM
Creatinine, Ser: 0.92 mg/dL (ref 0.44–1.00)
GFR, Estimated: >60 mL/min (ref >60)

## 2024-05-07 LAB — CBG MONITORING, ED
Glucose-Capillary: 102 mg/dL — ABNORMAL HIGH (ref 70–99)
Glucose-Capillary: 92 mg/dL (ref 70–99)

## 2024-05-07 LAB — HIV ANTIBODY (ROUTINE TESTING W REFLEX): HIV Screen 4th Generation wRfx: NONREACTIVE

## 2024-05-07 LAB — TROPONIN T, HIGH SENSITIVITY: Troponin T High Sensitivity: <15 ng/L (ref 0–19)

## 2024-05-07 MED ORDER — AMLODIPINE BESYLATE 5 MG PO TABS
5.0000 mg | ORAL_TABLET | Freq: Every day | ORAL | Status: DC
  Administered 2024-05-07 – 2024-05-09 (×3): 5 mg via ORAL
  Filled 2024-05-07 (×3): qty 1

## 2024-05-07 MED ORDER — CLONAZEPAM 0.25 MG PO TBDP
0.2500 mg | ORAL_TABLET | Freq: Every evening | ORAL | Status: DC | PRN
  Administered 2024-05-07: 0.25 mg via ORAL
  Filled 2024-05-07: qty 2

## 2024-05-07 MED ORDER — INSULIN ASPART 100 UNIT/ML IJ SOLN: 0.0000 [IU] | Freq: Three times a day (TID) | INTRAMUSCULAR | Status: DC

## 2024-05-07 MED ORDER — ENOXAPARIN SODIUM 40 MG/0.4ML IJ SOSY
40.0000 mg | PREFILLED_SYRINGE | INTRAMUSCULAR | Status: DC
  Administered 2024-05-07 – 2024-05-08 (×2): 40 mg via SUBCUTANEOUS
  Filled 2024-05-07 (×2): qty 0.4

## 2024-05-07 MED ORDER — SIMVASTATIN 10 MG PO TABS
40.0000 mg | ORAL_TABLET | Freq: Every day | ORAL | Status: DC
  Filled 2024-05-07: qty 4

## 2024-05-07 NOTE — ED Notes (Signed)
 Called CCMD to place patient on monitor

## 2024-05-07 NOTE — ED Notes (Signed)
 Pt walked to the bathroom independently and assisted back in bed from the chair. No other needs at this time.

## 2024-05-07 NOTE — ED Notes (Signed)
 Called patient's son per her request, no answer but left voicemail to call back.

## 2024-05-07 NOTE — ED Triage Notes (Signed)
 Pt arrives via EMS from home; st was watching TV then had sudden onset of SHOB, like couldn't get a deep breath; st hx of same and dx with panic attacks; recently med change due to bradycardia; denies symptoms currently

## 2024-05-07 NOTE — ED Provider Notes (Signed)
 [facility] Provider Note    Event Date/Time   First MD Initiated Contact with Patient 05/07/24 [phone]     (approximate)   History   Panic Attack   HPI  Monica Curtis is a 63 y.o. female with history of bradycardia with recent medication changes who comes in with concerns for shortness of breath.  Patient has a history of schizoaffective disorder, hypertension.  She reports feeling short of breath.  She reports feeling more anxious.  She reports denies any risk factors for PE.  She denies any infection symptoms, swelling in her legs or other concerns.  Denies any SI.     Physical Exam   Triage Vital Signs: ED Triage Vitals  Encounter Vitals Group     BP 05/07/24 0059 (!) 149/75     Girls Systolic BP Percentile --      Girls Diastolic BP Percentile --      Boys Systolic BP Percentile --      Boys Diastolic BP Percentile --      Pulse Rate 05/07/24 0059 74     Resp 05/07/24 0059 18     Temp 05/07/24 0059 97.9 F (36.6 C)     Temp Source 05/07/24 0059 Oral     SpO2 05/07/24 0059 97 %     Weight 05/07/24 0057 167 lb (75.8 kg)     Height 05/07/24 0057 5' 4 (1.626 m)     Head Circumference --      Peak Flow --      Pain Score 05/07/24 0057 0     Pain Loc --      Pain Education --      Exclude from Growth Chart --     Most recent vital signs: Vitals:   05/07/24 0059 05/07/24 0421  BP: (!) 149/75 (!) 164/81  Pulse: 74 78  Resp: 18 18  Temp: 97.9 F (36.6 C) 98.2 F (36.8 C)  SpO2: 97% 98%     General: Awake, no distress.  CV:  Good peripheral perfusion.  Resp:  Normal effort.  Abd:  No distention.  Soft and nontender Other:  Patient reports feeling anxious.  No SI.  No swelling in legs.  No calf tenderness   ED Results / Procedures / Treatments   Labs (all labs ordered are listed, but only abnormal results are displayed) Labs Reviewed  BASIC METABOLIC PANEL WITH GFR - Abnormal; Notable for the following components:       Result Value   Glucose, Bld 112 (*)    Creatinine, Ser 1.10 (*)    GFR, Estimated 57 (*)    All other components within normal limits  CBC  MAGNESIUM  TROPONIN T, HIGH SENSITIVITY     EKG  My interpretation of EKG:  Normal sinus rate of 73 without any ST elevation, no T wave versions but QTc of 570  Normal sinus rate of 79 without any ST elevation but QTc at 639.  No T wave inversions,    RADIOLOGY I have reviewed the xray personally and interpreted no pneumonia   PROCEDURES:  Critical Care performed: No  .1-3 Lead EKG Interpretation  Performed by: Ernest Ronal BRAVO, MD Authorized by: Ernest Ronal BRAVO, MD     Interpretation: normal     ECG rate assessment: normal     Rhythm: sinus rhythm     Ectopy: none     Conduction: normal      MEDICATIONS ORDERED IN ED: Medications - No  data to display   IMPRESSION / MDM / ASSESSMENT AND PLAN / ED COURSE  I reviewed the triage vital signs and the nursing notes.   Patient's presentation is most consistent with acute presentation with potential threat to life or bodily function.  Patient comes in with anxiety, shortness of breath workup was done to evaluate for pneumonia, ACS.  Could be related to anxiety.  Patient placed on the monitor for evaluation of cardiac arrhythmia.  Patient has no risk factors for PE and this seems less likely no evidence of DVT on examination.   Troponin was negative.  BMP shows creatinine of 1.1 up from 3 weeks ago.  CBC reassuring magnesium normal.  Potassium is 3.8.  Patient had 2 EKGs showing significantly prolonged QTc.  Given her shortness of breath with prolonged QTc suspect a component of this is anxiety and potentially related to her psychiatric medications.  Given how prolonged it is I did discuss with patient admission for cardiac monitoring and psychiatric consultation to adjust medications.  She expressed understanding and felt comfortable with this plan.  The patient is on the cardiac  monitor to evaluate for evidence of arrhythmia and/or significant heart rate changes.      FINAL CLINICAL IMPRESSION(S) / ED DIAGNOSES   Final diagnoses:  QT prolongation  Shortness of breath  Anxiety     Rx / DC Orders   ED Discharge Orders     None        Note:  This document was prepared using Dragon voice recognition software and may include unintentional dictation errors.   Ernest Ronal BRAVO, MD 05/07/24 [phone]

## 2024-05-07 NOTE — Consult Note (Signed)
 [facility] Psychiatric Consult Initial  Patient Name: .KALICIA DUFRESNE  MRN: [id number]  DOB: Jan 29, 1961  Consult Order details:  Orders (From admission, onward)     Start     Ordered   05/07/24 0624  IP CONSULT TO PSYCHIATRY       Ordering Provider: Ernest Ronal BRAVO, MD  Provider:  (Not yet assigned)  Question:  Reason for consult:  Answer:  Medication management   05/07/24 0624             Mode of Visit: In person    Psychiatry Consult Evaluation  Service Date: May 07, 2024 LOS:  LOS: 0 days  Chief Complaint They told me there was something wrong with my heart  Primary Psychiatric Diagnoses  QT prolongation Noted history of schizoaffective disorder, anxiety, tardive dyskinesia, cognitive impairment listed in patient most recent outpatient visit   Assessment   VALBONA SLABACH is a 63 y.o. female admitted: Medicallyfor 05/07/2024  5:47 AM for concerns for shortness of breath and noted qtc prolongation. She carries the psychiatric diagnoses of schizoaffective disorder, anxiety, tardive dyskinesia, cognitive impairment and has a past medical history of  bradycardia.   Psychiatry was consulted due to noted QTc prolongation and patient being on multiple psychotropic medications.  On exam today, patient noted to be poor historian.  Patient denied current suicidal or homicidal ideations.  Patient denied current auditory or visual hallucinations as well.  Patient preferred for psychiatry team to reach out to her outpatient provider Dr. Vickey as well as her son Donnice for further collateral information. On current presentation there was no evidence of psychosis or mania and patient did not appear to be responding to internal stimuli. At this time, patient does not appear to be a risk to self or others. Psychiatry team spoke with patient's outpatient psych provider as well as her son. Per Dr. Vickey and son, patient has been on invega  injections for many years (10 years son  believes) and actually missed her last injection that was supposed to be on 04/30/2024 due to bradycardia, so she has not had this medication since October. Patient is noted to be poor historian with this provider and could not tell me what her current medication regimen was supposed to be. It is noted on last outpatient visit, they were working to transition her antidepressant. Patient is managing her own medications at home, per son, but he also has concerns of her ability to do so. Patient reported she often forgets if she had already taken the medication or if she took enough/too much. There is concern that patient may have accidentally over medicated herself or took the wrong medications accidentally due to so many recent medication changes and the acute change in her qtc.  It is noted that on previous visit on 05/01/2024 patient's QTc was 414.  Patient's QTc today was noted to be 639 on most recent EKG.  Patient denied S/I attempt and son was adamant he did not think mother intentionally took too many of her medications, but he does worry about her ability to safely care for self due to cognitive concerns.  Her son did also report that his aunt told him today that this prolonged qtc runs in their family.  Psychiatry will continue to round on patient while medically admitted and assess medical team with further psychotropic medication management.  We did recommend to medical team that cardiology be involved due to family history of prolonged QTc.    Diagnoses:  Active  Hospital problems: Principal Problem:   QT prolongation    Plan   ## Psychiatric Medication Recommendations:  Psychiatry added on a lamotrigine  level to blood work. Pending this, we will hold all other medications and only start patient back on her lamotrigine  and clonazepam  for the time being due to her current Qtc.  ## Medical Decision Making Capacity: Not specifically addressed in this encounter  ## Further Work-up:   -- most  recent EKG on 05/07/2024 had QtC of 639 -- Pertinent labwork reviewed earlier this admission includes: BMP, CBC, magnesium, pending lamotrigine  level   ## Disposition:--Patient is being medically admitted-patient does not meet criteria for current inpatient psychiatric admission  ## Behavioral / Environmental: - No specific recommendations at this time.     ## Safety and Observation Level:  - Based on my clinical evaluation, I estimate the patient to be at low risk of self harm in the current setting. - At this time, we recommend  routine. This decision is based on my review of the chart including patient's history and current presentation, interview of the patient, mental status examination, and consideration of suicide risk including evaluating suicidal ideation, plan, intent, suicidal or self-harm behaviors, risk factors, and protective factors. This judgment is based on our ability to directly address suicide risk, implement suicide prevention strategies, and develop a safety plan while the patient is in the clinical setting. Please contact our team if there is a concern that risk level has changed.  CSSR Risk Category:C-SSRS RISK CATEGORY: No Risk  Suicide Risk Assessment: Patient has following modifiable risk factors for suicide: current symptoms: anxiety/panic, insomnia, impulsivity, anhedonia, hopelessness, which we are addressing by medically admitting patient for further monitoring and management of current medication regimen. Patient has following non-modifiable or demographic risk factors for suicide: psychiatric hospitalization Patient has the following protective factors against suicide: Access to outpatient mental health care and Supportive family  Thank you for this consult request. Recommendations have been communicated to the primary team.  We will continue to round on patient while medically admitted at this time.   Zelda Sharps, NP        History of Present Illness   Relevant Aspects of [facility]   Patient Report:  Psychiatry was consulted due to noted QTc prolongation and patient being on multiple psychotropic medications.  On exam today, patient noted to be poor historian.  Patient denied current suicidal or homicidal ideations.  Patient denied current auditory or visual hallucinations as well.  Patient preferred for psychiatry team to reach out to her outpatient provider Dr. Vickey as well as her son Donnice for further collateral information. On current presentation there was no evidence of psychosis or mania and patient did not appear to be responding to internal stimuli. At this time, patient does not appear to be a risk to self or others. Psychiatry team spoke with patient's outpatient psych provider as well as her son. Per Dr. Vickey and son, patient has been on invega  injections for many years (10 years son believes) and actually missed her last injection that was supposed to be on 04/30/2024 due to bradycardia, so she has not had this medication since October. Patient is noted to be poor historian with this provider and could not tell me what her current medication regimen was supposed to be. It is noted on last outpatient visit, they were working to transition her antidepressant. Patient is managing her own medications at home, per son, but he also has concerns of her ability  to do so. Patient reported she often forgets if she had already taken the medication or if she took enough/too much. There is concern that patient may have accidentally over medicated herself or took the wrong medications accidentally due to so many recent medication changes and the acute change in her qtc.  It is noted that on previous visit on 05/01/2024 patient's QTc was 414.  Patient's QTc today was noted to be 639 on most recent EKG.  Patient denied S/I attempt and son was adamant he did not think mother intentionally took too many of her medications, but he does worry about her  ability to safely care for self due to cognitive concerns.  Her son did also report that his aunt told him today that this prolonged qtc runs in their family.  Psychiatry will continue to round on patient while medically admitted and assess medical team with further psychotropic medication management.  We did recommend to medical team that cardiology be involved due to family history of prolonged QTc.  Patient's son, Donnice, provided most of information due to patient being poor historian.  Donnice reported that patient was recently evaluated by neurologist per request from her outpatient psychiatrist due to both Donnice and her psychiatrist concern with patient's current cognitive status.  Donnice voiced concern about patient's ability to safely manage her own medications.  Patient did report that she is post to be using a pill organizer to take her medications, however recently she has just been taking the medications out of the bottle.  She was unable to tell this provider the amounts of each medication she was supposed to be taking or her current medication regimen.  When this provider mentioned the medication Wellbutrin , patient appeared confused and could not tell this provider if she was supposed to be taking this medication or not.  Per outpatient psychiatrist, patient is currently supposed to be on Lexapro , clonazepam , Wellbutrin , lamotrigine , and monthly Invega  injections.  Recently, patient was in the process of switching from Viibryd  to Lexapro .  Patient was confused and unable to discuss this titration currently.  Patient reported she believed she had switched to Lexapro , but was unsure.  Patient was able to tell this provider correct location, year, as well as month.  She was also able to report to this provider that she came to the emergency room initially for shortness of breath.  Patient did voice concerns to this provider about potentially having to change medications in her current  situation due to previous history of psychosis.  Patient reported in the past that she had seen something I was not supposed to see and got really paranoid at work that led to an episode of psychosis.  Collateral information from Donnice was provided.  Donnice reported that mother struggled with her mental health often when he was a young child.  He reported she was hospitalized multiple times when he was young, and he believes she has been on the Invega  injection for 10 to 15 years.  He reported she has been stable on this injection.  Donnice denied any recent psychiatric hospitalizations.  Psych ROS:  Depression: Endorsed related to medication changes Anxiety: Endorsed related to medication changes Mania (lifetime and current): No current evidence Psychosis: (lifetime and current): No current evidence, patient reported history of psychosis  Collateral information:  Jhonny Donnice at number listed in chart on 05/07/2024    Psychiatric and Social History  Psychiatric History:  Information collected from patient, Donnice, Dr. Vickey, chart review  Prev  Dx/Sx: schizoaffective disorder, anxiety, tardive dyskinesia, cognitive impairment Current Psych Provider: Dr. Vickey Home Meds (current): See above Previous Med Trials: Patient unsure Therapy: Unsure  Prior Psych Hospitalization: Yes Prior Self Harm: Patient denied Prior Violence: Denied  Family Psych History: Unsure Family Hx suicide: Patient denied  Social History:    Occupational Hx: Per review of notes, patient recently quit job Legal Hx: Denied Living Situation: Alone Spiritual Hx: Unknown Access to weapons/lethal means: Denied  Substance History Alcohol: Denied Tobacco: Denied Illicit drugs: Denied Prescription drug abuse: Denied Rehab hx: Denied  Exam Findings  Physical Exam: Reviewed and agree with the physical exam findings conducted by the medical provider Vital Signs:  Temp:  [97.9 F (36.6 C)-98.2 F  (36.8 C)] 98.2 F (36.8 C) (11/25 0421) Pulse Rate:  [65-78] 68 (11/25 0800) Resp:  [18-20] 20 (11/25 0800) BP: (149-164)/(70-81) 155/70 (11/25 0800) SpO2:  [97 %-100 %] 100 % (11/25 0800) Weight:  [75.8 kg] 75.8 kg (11/25 0057) Blood pressure (!) 155/70, pulse 68, temperature 98.2 F (36.8 C), temperature source Oral, resp. rate 20, height 5' 4 (1.626 m), weight 75.8 kg, SpO2 100%. Body mass index is 28.67 kg/m.    Mental Status Exam: General Appearance: Casual  Orientation:  Full (Time, Place, and Person)  Memory:  Immediate;   Fair Recent;   Poor Remote;   Poor  Concentration:  Concentration: Fair  Recall:  Poor  Attention  Fair  Eye Contact:  Good  Speech:  Clear and Coherent  Language:  Fair  Volume:  Normal  Mood: anxious regarding medications  Affect:  Congruent  Thought Process:  Coherent  Thought Content:  Logical  Suicidal Thoughts:  No  Homicidal Thoughts:  No  Judgement:  Impaired  Insight:  Present  Psychomotor Activity:  Normal  Akathisia:  No  Fund of Knowledge:  Fair      Assets:  Manufacturing Systems Engineer Desire for Improvement Housing Social Support  Cognition:  Impaired,  Mild  ADL's:  Intact  AIMS (if indicated):        Other History   These have been pulled in through the EMR, reviewed, and updated if appropriate.  Family History:  The patient's family history includes Alcohol abuse in her father; Breast cancer in her paternal grandmother and sister; Dementia in her maternal aunt; Depression in her sister; Drug abuse in her father.  Medical History: Past Medical History:  Diagnosis Date   Depression    Diabetes mellitus without complication (HCC)    Hyperlipidemia    Hypertension     Surgical History: Past Surgical History:  Procedure Laterality Date   ABDOMINAL HYSTERECTOMY     CESAREAN SECTION     COLONOSCOPY     COLONOSCOPY WITH PROPOFOL  N/A 01/16/2018   Procedure: COLONOSCOPY WITH PROPOFOL ;  Surgeon: Gaylyn Gladis PENNER, MD;   Location: [facility] ENDOSCOPY;  Service: Endoscopy;  Laterality: N/A;   COLONOSCOPY WITH PROPOFOL  N/A 12/23/2020   Procedure: COLONOSCOPY WITH PROPOFOL ;  Surgeon: Toledo, Ladell POUR, MD;  Location: [facility] ENDOSCOPY;  Service: Gastroenterology;  Laterality: N/A;   ESOPHAGOGASTRODUODENOSCOPY       Medications:   Current Facility-Administered Medications:    amLODipine  (NORVASC ) tablet 5 mg, 5 mg, Oral, Daily, Djan, Prince T, MD   enoxaparin  (LOVENOX ) injection 40 mg, 40 mg, Subcutaneous, Q24H, Djan, Prince T, MD   insulin  aspart (novoLOG ) injection 0-15 Units, 0-15 Units, Subcutaneous, TID WC, Djan, Drue DASEN, MD   paliperidone  (INVEGA  SUSTENNA) injection 234 mg, 234 mg, Intramuscular, Q28 days, ,  234 mg at 04/03/24 1138   simvastatin  (ZOCOR ) tablet 40 mg, 40 mg, Oral, QHS, Djan, Drue DASEN, MD  Current Outpatient Medications:    buPROPion  (WELLBUTRIN  XL) 150 MG 24 hr tablet, Take 1 tablet (150 mg total) by mouth daily. Takes a total of 450 mg daily. Take along with 300 mg tab, Disp: 90 tablet, Rfl: 1   buPROPion  (WELLBUTRIN  XL) 300 MG 24 hr tablet, Take 1 tablet (300 mg total) by mouth daily. Total of 450 mg daily. Take along with 150 mg tab, Disp: 90 tablet, Rfl: 1   cabergoline (DOSTINEX) 0.5 MG tablet, Take 0.5 mg by mouth 2 (two) times a week., Disp: , Rfl:    cetirizine (ZYRTEC) 10 MG tablet, Take 10 mg by mouth daily., Disp: , Rfl:    [START ON 05/10/2024] clonazePAM  (KLONOPIN ) 0.25 MG disintegrating tablet, Take 1 tablet (0.25 mg total) by mouth at bedtime as needed (anxiety)., Disp: 30 tablet, Rfl: 0   escitalopram  (LEXAPRO ) 20 MG tablet, 10 mg daily for one week, then 20 mg daily, Disp: 30 tablet, Rfl: 1   lamoTRIgine  (LAMICTAL ) 200 MG tablet, Take 2 tablets (400 mg total) by mouth daily., Disp: 180 tablet, Rfl: 1   ofloxacin  (FLOXIN ) 0.3 % OTIC solution, Place 10 drops into the left ear daily., Disp: 5 mL, Rfl: 0   paliperidone  (INVEGA  SUSTENNA) 234 MG/1.5ML injection, Inject 234 mg into the  muscle every 28 (twenty-eight) days for 6 doses., Disp: 1.5 mL, Rfl: 5   predniSONE  (STERAPRED UNI-PAK 21 TAB) 10 MG (21) TBPK tablet, Take by mouth daily. Take 6 tabs by mouth daily  for 1 days, then 5 tabs for 1 days, then 4 tabs for 1 days, then 3 tabs for 1 days, 2 tabs for 1 days, then 1 tab by mouth daily for 1 days, Disp: 21 tablet, Rfl: 0   simvastatin  (ZOCOR ) 40 MG tablet, Take 40 mg by mouth at bedtime., Disp: , Rfl:    venlafaxine  XR (EFFEXOR -XR) 150 MG 24 hr capsule, Take 1 capsule (150 mg total) by mouth daily with breakfast. Start after completing 75 mg daily for one week, Disp: 90 capsule, Rfl: 0   venlafaxine  XR (EFFEXOR -XR) 75 MG 24 hr capsule, Take 1 capsule (75 mg total) by mouth daily with breakfast for 7 days., Disp: 7 capsule, Rfl: 0   Vilazodone  HCl (VIIBRYD ) 10 MG TABS, 5 mg daily for one week, then 10 mg daily, Disp: 30 tablet, Rfl: 1  Allergies: Allergies  Allergen Reactions   Misc. Sulfonamide Containing Compounds Hives   Sulfa Antibiotics Hives    Zelda Sharps, NP This note was created using Scientist, clinical (histocompatibility and immunogenetics). Please excuse any inadvertent transcription errors. Case was discussed with supervising physician Dr. Jadapalle who is agreeable with current plan.

## 2024-05-07 NOTE — Consult Note (Signed)
 Monica Curtis       Patient ID: Monica Curtis: [id number] DOB/AGE: July 22, 1960 63 y.o.  Admit date: 05/07/2024 Referring Physician Dr. Drue Potter Primary Physician Monica Lenis, MD  Primary Cardiologist None Reason for Consultation QT prolongation  HPI: Monica Curtis is a 63 y.o. female  with a past medical history of schizoaffective disorder, hypertension, diabetes, hyperlipidemia who presented to the ED on 05/07/2024 for panic attack. QT noted to be prolonged on EKG. Cardiology was consulted for further evaluation.   Patient called EMS today and was brought in for evaluation after panic attack at home. Workup in the ED notable for creatinine 1.10, potassium 3.8, magnesium 2.3, hemoglobin 12.6, WBC 8.9. Troponin <15. EKG in the ED NSR rate 73 bpm, QTc 570 msec. Prior EKGs with normal QT. CXR without acute abnormality. Medications adjusted by psych.  At the time of my evaluation this afternoon, patient is resting in hospital bed without family at bedside. We discussed her presentation in further detail. Endorses recent changes to medications. Was previously on propranolol which was discontinued due to bradycardia.  She is without CP, SOB, palpitations. Discussed EKG findings and that prior EKGs have had normal QT suggesting medication as the cause.  Review of systems complete and found to be negative unless listed above    Past Medical History:  Diagnosis Date   Depression    Diabetes mellitus without complication (HCC)    Hyperlipidemia    Hypertension     Past Surgical History:  Procedure Laterality Date   ABDOMINAL HYSTERECTOMY     CESAREAN SECTION     COLONOSCOPY     COLONOSCOPY WITH PROPOFOL  N/A 01/16/2018   Procedure: COLONOSCOPY WITH PROPOFOL ;  Surgeon: Gaylyn Gladis PENNER, MD;  Location: [facility] ENDOSCOPY;  Service: Endoscopy;  Laterality: N/A;   COLONOSCOPY WITH PROPOFOL  N/A 12/23/2020   Procedure: COLONOSCOPY WITH PROPOFOL ;  Surgeon:  Toledo, Ladell POUR, MD;  Location: [facility] ENDOSCOPY;  Service: Gastroenterology;  Laterality: N/A;   ESOPHAGOGASTRODUODENOSCOPY      (Not in a hospital admission)  Social History   Socioeconomic History   Marital status: Divorced    Spouse name: Not on file   Number of children: 2   Years of education: Not on file   Highest education level: Associate degree: academic program  Occupational History   Not on file  Tobacco Use   Smoking status: Never   Smokeless tobacco: Never  Vaping Use   Vaping status: Never Used  Substance and Sexual Activity   Alcohol use: Not Currently   Drug use: Never   Sexual activity: Not Currently  Other Topics Concern   Not on file  Social History Narrative   Not on file   Social Drivers of Health   Financial Resource Strain: Low Risk  (03/20/2024)   Received from [facility]   Overall Financial Resource Strain (CARDIA)    Difficulty of Paying Living Expenses: Not hard at all  Food Insecurity: No Food Insecurity (03/20/2024)   Received from [facility]   Hunger Vital Sign    Within the past 12 months, you worried that your food would run out before you got the money to buy more.: Never true    Within the past 12 months, the food you bought just didn't last and you didn't have money to get more.: Never true  Transportation Needs: No Transportation Needs (03/20/2024)   Received from [facility]   [facility] - Transportation  In the past 12 months, has lack of transportation kept you from medical appointments or from getting medications?: No    Lack of Transportation (Non-Medical): No  Physical Activity: Not on file  Stress: Not on file  Social Connections: Not on file  Intimate Partner Violence: Not on file    Family History  Problem Relation Age of Onset   Drug abuse Father    Alcohol abuse Father    Depression Sister    Breast cancer Sister    Dementia Maternal Aunt    Breast cancer  Paternal Grandmother      Vitals:   05/07/24 0800 05/07/24 1026 05/07/24 1030 05/07/24 1255  BP: (!) 155/70 (!) 154/69 (!) 146/64   Pulse: 68  70   Resp: 20  20   Temp:    98.7 F (37.1 C)  TempSrc:    Oral  SpO2: 100%  100%   Weight:      Height:        PHYSICAL EXAM General: Well appearing female, well nourished, in no acute distress. HEENT: Normocephalic and atraumatic. Neck: No JVD.  Lungs: Normal respiratory effort on room air. Clear bilaterally to auscultation. No wheezes, crackles, rhonchi.  Heart: HRRR. Normal S1 and S2 without gallops or murmurs.  Abdomen: Non-distended appearing.  Msk: Normal strength and tone for age. Extremities: Warm and well perfused. No clubbing, cyanosis. No edema.  Neuro: Alert and oriented X 3. Psych: Answers questions appropriately.   Labs: Basic Metabolic Panel: Recent Labs    05/07/24 0107 05/07/24 1007  NA 138  --   K 3.8  --   CL 100  --   CO2 26  --   GLUCOSE 112*  --   BUN 10  --   CREATININE 1.10* 0.92  CALCIUM  9.2  --   MG 2.3  --    Liver Function Tests: No results for input(s): AST, ALT, ALKPHOS, BILITOT, PROT, ALBUMIN in the last 72 hours. No results for input(s): LIPASE, AMYLASE in the last 72 hours. CBC: Recent Labs    05/07/24 0107 05/07/24 1007  WBC 8.9 8.0  HGB 12.6 12.8  HCT 38.9 38.7  MCV 93.3 92.6  PLT 208 203   Cardiac Enzymes: No results for input(s): CKTOTAL, CKMB, CKMBINDEX, TROPONINIHS in the last 72 hours. BNP: No results for input(s): BNP in the last 72 hours. D-Dimer: No results for input(s): DDIMER in the last 72 hours. Hemoglobin A1C: No results for input(s): HGBA1C in the last 72 hours. Fasting Lipid Panel: No results for input(s): CHOL, HDL, LDLCALC, TRIG, CHOLHDL, LDLDIRECT in the last 72 hours. Thyroid Function Tests: No results for input(s): TSH, T4TOTAL, T3FREE, THYROIDAB in the last 72 hours.  Invalid input(s):  FREET3 Anemia Panel: No results for input(s): VITAMINB12, FOLATE, FERRITIN, TIBC, IRON, RETICCTPCT in the last 72 hours.   Radiology: DG Chest 2 View Result Date: 05/07/2024 EXAM: PA AND LATERAL (2) VIEW(S) XRAY OF THE CHEST 05/07/2024 01:33:00 AM COMPARISON: Recent PA and lateral chest 05/01/2024. CLINICAL HISTORY: SHOB SHOB FINDINGS: LUNGS AND PLEURA: No focal pulmonary opacity. No pleural effusion. No pneumothorax. HEART AND MEDIASTINUM: No acute abnormality of the cardiac and mediastinal silhouettes. BONES AND SOFT TISSUES: No acute osseous abnormality. IMPRESSION: 1. No acute cardiopulmonary process. Stable chest. Electronically signed by: Francis Quam MD 05/07/2024 01:37 AM EST RP Workstation: [id number]   DG Chest 2 View Result Date: 05/01/2024 EXAM: 2 VIEW(S) XRAY OF THE CHEST 05/01/2024 12:34:03 PM COMPARISON: Comparison 04/12/2024. CLINICAL HISTORY: bradycardia FINDINGS: LUNGS  AND PLEURA: No focal pulmonary opacity. No pleural effusion. No pneumothorax. HEART AND MEDIASTINUM: No acute abnormality of the cardiac and mediastinal silhouettes. BONES AND SOFT TISSUES: No acute osseous abnormality. IMPRESSION: 1. No acute cardiopulmonary process. Electronically signed by: Lynwood Seip MD 05/01/2024 12:59 PM EST RP Workstation: [id number]   DG Chest 2 View Result Date: 04/12/2024 EXAM: 2 VIEW(S) XRAY OF THE CHEST 04/12/2024 01:17:34 AM COMPARISON: None available. CLINICAL HISTORY: SHOB SHOB FINDINGS: LUNGS AND PLEURA: No focal pulmonary opacity. No pulmonary edema. No pleural effusion. No pneumothorax. HEART AND MEDIASTINUM: Atherosclerotic plaque. BONES AND SOFT TISSUES: No acute osseous abnormality. IMPRESSION: 1. No acute process. Electronically signed by: Morgane Naveau MD 04/12/2024 01:24 AM EDT RP Workstation: [id number]    ECHO ordered  TELEMETRY (personally reviewed): not on tele  EKG (personally reviewed): NSR rate 73 bpm, QTc 570 msec  Data reviewed by me 05/07/2024:  last 24h vitals tele labs imaging I/O ED provider Curtis, admission H&P  Principal Problem:   QT prolongation    ASSESSMENT AND PLAN:  Monica Curtis is a 63 y.o. female  with a past medical history of schizoaffective disorder, hypertension, diabetes, hyperlipidemia who presented to the ED on 05/07/2024 for panic attack. QT noted to be prolonged on EKG. Cardiology was consulted for further evaluation.   # QT prolongation  # Schizoaffective disorder # Hypertension # Hyperlipidemia Patient with hx of schizoaffective disorder on multiple medications presented after panic attack. QT noted to be long on EKG. Psychiatry following and adjusting medications.  -Echo ordered for surveillance.  -Further management of medications per psychiatry. Cardiac cause of QT prolongation is unlikely as this has been normal on all prior EKGs.  -Continue amlodipine  5 mg daily.  -Continue simvastatin  40 mg daily.    This patient's plan of care was discussed and created with Dr. Custovic and she is in agreement.  Signed: Danita Bloch, PA-C  05/07/2024, 1:59 PM [facility] Cardiology

## 2024-05-07 NOTE — ED Notes (Signed)
 Monica Curtis (son), called back and was given an update.

## 2024-05-07 NOTE — H&P (Signed)
 History and Physical    Patient: Monica Curtis FMW:[id number] DOB: May 15, 1961 DOA: 05/07/2024 DOS: the patient was seen and examined on 05/07/2024 PCP: Glover Lenis, MD  Patient coming from: Home  Chief Complaint: Shortness of breath Chief Complaint  Patient presents with   Panic Attack   HPI: Monica Curtis is a 63 y.o. female with medical history significant of hypertension, diabetes, schizoaffective disorder, hyperlipidemia who presents to the emergency room with complaints of shortness of breath.  According to patient she was otherwise well until around 10 PM last night as well as sitting down watching TV she started experiencing some shortness of breath with associated anxiety.  She denies nausea vomiting chest pain cough abdominal pain.  She therefore decided to come to the emergency room for further management  ED course: Upon arrival to the emergency room patient was found to have temperature 97.9, pulse 74, respiratory rate 18, blood pressure 149/75 saturating 97% on room air EKG was done that did not show any signs of ischemia however QTc found to be 639.  Given these concerning and the fact that patient is on several psych medications ED physician discussed the case with psychiatrist and patient will be admitted for observation and monitoring of QTc as well as psych meds adjustment.  Review of Systems: As mentioned in the history of present illness. All other systems reviewed and are negative.  Past Medical History:  Diagnosis Date   Depression    Diabetes mellitus without complication (HCC)    Hyperlipidemia    Hypertension    Past Surgical History:  Procedure Laterality Date   ABDOMINAL HYSTERECTOMY     CESAREAN SECTION     COLONOSCOPY     COLONOSCOPY WITH PROPOFOL  N/A 01/16/2018   Procedure: COLONOSCOPY WITH PROPOFOL ;  Surgeon: Gaylyn Gladis PENNER, MD;  Location: [facility] ENDOSCOPY;  Service: Endoscopy;  Laterality: N/A;   COLONOSCOPY WITH PROPOFOL  N/A 12/23/2020    Procedure: COLONOSCOPY WITH PROPOFOL ;  Surgeon: Toledo, Ladell POUR, MD;  Location: [facility] ENDOSCOPY;  Service: Gastroenterology;  Laterality: N/A;   ESOPHAGOGASTRODUODENOSCOPY     Social History:  reports that she has never smoked. She has never used smokeless tobacco. She reports that she does not currently use alcohol. She reports that she does not use drugs.  Allergies  Allergen Reactions   Misc. Sulfonamide Containing Compounds Hives   Sulfa Antibiotics Hives    Family History  Problem Relation Age of Onset   Drug abuse Father    Alcohol abuse Father    Depression Sister    Breast cancer Sister    Dementia Maternal Aunt    Breast cancer Paternal Grandmother     Prior to Admission medications   Medication Sig Start Date End Date Taking? Authorizing Provider  buPROPion  (WELLBUTRIN  XL) 150 MG 24 hr tablet Take 1 tablet (150 mg total) by mouth daily. Takes a total of 450 mg daily. Take along with 300 mg tab 01/29/24 07/27/24  Vickey Mettle, MD  buPROPion  (WELLBUTRIN  XL) 300 MG 24 hr tablet Take 1 tablet (300 mg total) by mouth daily. Total of 450 mg daily. Take along with 150 mg tab 04/16/24 10/13/24  Vickey Mettle, MD  cabergoline (DOSTINEX) 0.5 MG tablet Take 0.5 mg by mouth 2 (two) times a week.    [provider]  cetirizine (ZYRTEC) 10 MG tablet Take 10 mg by mouth daily.    [provider]  clonazePAM  (KLONOPIN ) 0.25 MG disintegrating tablet Take 1 tablet (0.25 mg total) by mouth at  bedtime as needed (anxiety). 05/10/24 06/09/24  Vickey Mettle, MD  escitalopram  (LEXAPRO ) 20 MG tablet 10 mg daily for one week, then 20 mg daily 04/16/24   Hisada, Reina, MD  lamoTRIgine  (LAMICTAL ) 200 MG tablet Take 2 tablets (400 mg total) by mouth daily. 01/08/24 07/06/24  Vickey Mettle, MD  ofloxacin  (FLOXIN ) 0.3 % OTIC solution Place 10 drops into the left ear daily. 11/03/23   Teresa Shelba SAUNDERS, NP  paliperidone  (INVEGA  SUSTENNA) 234 MG/1.5ML injection Inject 234 mg into the muscle every  28 (twenty-eight) days for 6 doses. 12/01/23 04/20/24  Vickey Mettle, MD  predniSONE  (STERAPRED UNI-PAK 21 TAB) 10 MG (21) TBPK tablet Take by mouth daily. Take 6 tabs by mouth daily  for 1 days, then 5 tabs for 1 days, then 4 tabs for 1 days, then 3 tabs for 1 days, 2 tabs for 1 days, then 1 tab by mouth daily for 1 days 11/03/23   Teresa Shelba SAUNDERS, NP  simvastatin  (ZOCOR ) 40 MG tablet Take 40 mg by mouth at bedtime. 03/16/22   [provider]  venlafaxine  XR (EFFEXOR -XR) 150 MG 24 hr capsule Take 1 capsule (150 mg total) by mouth daily with breakfast. Start after completing 75 mg daily for one week 12/30/23 03/29/24  Vickey Mettle, MD  venlafaxine  XR (EFFEXOR -XR) 75 MG 24 hr capsule Take 1 capsule (75 mg total) by mouth daily with breakfast for 7 days. 02/19/24 02/26/24  Vickey Mettle, MD  Vilazodone  HCl (VIIBRYD ) 10 MG TABS 5 mg daily for one week, then 10 mg daily 02/19/24   Vickey Mettle, MD    Physical Exam: Vitals:   05/07/24 0059 05/07/24 0421 05/07/24 0700 05/07/24 0800  BP: (!) 149/75 (!) 164/81 (!) 157/80 (!) 155/70  Pulse: 74 78 65 68  Resp: 18 18 20 20   Temp: 97.9 F (36.6 C) 98.2 F (36.8 C)    TempSrc: Oral Oral    SpO2: 97% 98% 100% 100%  Weight:      Height:       Gen: NAD CV: S1, S2 present no murmur head Resp: Clear to auscultation bilaterally Abd: Nontender MSK: No edema, moving all extremities equally Neuro: Alert and oriented x 4 Psych: Pleasant mood   Data Reviewed:  Chest x-ray reviewed that did not show any cardiopulmonary process     Latest Ref Rng & Units 05/07/2024    1:07 AM 05/01/2024   11:50 AM 04/12/2024   12:50 AM  BMP  Glucose 70 - 99 mg/dL 887  878  863   BUN 8 - 23 mg/dL 10  14  12    Creatinine 0.44 - 1.00 mg/dL 8.89  8.86  9.02   Sodium 135 - 145 mmol/L 138  139  139   Potassium 3.5 - 5.1 mmol/L 3.8  4.7  3.3   Chloride 98 - 111 mmol/L 100  99  101   CO2 22 - 32 mmol/L 26  30  26    Calcium  8.9 - 10.3 mg/dL 9.2  9.3  9.0         Latest Ref Rng & Units 05/07/2024    1:07 AM 05/01/2024   11:50 AM 04/12/2024   12:50 AM  CBC  WBC 4.0 - 10.5 K/uL 8.9  8.2  9.2   Hemoglobin 12.0 - 15.0 g/dL 87.3  87.0  86.3   Hematocrit 36.0 - 46.0 % 38.9  40.5  41.1   Platelets 150 - 400 K/uL 208  222  247  Assessment and Plan:  Prolonged Qtc Patient presented with QTc of 639 in the emergency room in the setting of shortness of breath Patient on several psych medications Psychiatry was consulted by ED physician for medication adjustment Patient's family also at that that QT prolongation runs in their family and so cardiology have been consulted for evaluation of possible primary cardiac etiology Will monitor on telemetry for now  Schizoaffective disorder Psychiatry is consulted for adjustment of psych meds in the setting of prolonged Qtc  Essential hypertension Continue amlodipine   Diabetes mellitus type 2 A1c was 5.5.  6 months ago Monitor glucose levels Have been placed on sliding scale insulin  only as needed.  Hyperlipidemia Continue statin therapy  DVT prophylaxis-placed on Lovenox    Advance Care Planning:   Code Status: Full Code full code  Consults: Psychiatry  Family Communication: None present at bedside  Severity of Illness: The appropriate patient status for this patient is OBSERVATION. Observation status is judged to be reasonable and necessary in order to provide the required intensity of service to ensure the patient's safety. The patient's presenting symptoms, physical exam findings, and initial radiographic and laboratory data in the context of their medical condition is felt to place them at decreased risk for further clinical deterioration. Furthermore, it is anticipated that the patient will be medically stable for discharge from the hospital within 2 midnights of admission.   Author: Drue ONEIDA Potter, MD 05/07/2024 8:55 AM  For on call review [email or web address].

## 2024-05-08 ENCOUNTER — Ambulatory Visit

## 2024-05-08 ENCOUNTER — Observation Stay
Admit: 2024-05-08 | Discharge: 2024-05-08 | Disposition: A | Discharge status: Home / Self Care | Attending: Student | Admitting: Student

## 2024-05-08 DIAGNOSIS — R9431 Abnormal electrocardiogram [ECG] [EKG]: Secondary | ICD-10-CM | POA: Diagnosis not present

## 2024-05-08 LAB — CBG MONITORING, ED
Glucose-Capillary: 104 mg/dL — ABNORMAL HIGH (ref 70–99)
Glucose-Capillary: 112 mg/dL — ABNORMAL HIGH (ref 70–99)
Glucose-Capillary: 112 mg/dL — ABNORMAL HIGH (ref 70–99)

## 2024-05-08 LAB — CBC
HCT: 38.5 % (ref 36.0–46.0)
Hemoglobin: 12.5 g/dL (ref 12.0–15.0)
MCH: 30.3 pg (ref 26.0–34.0)
MCHC: 32.5 g/dL (ref 30.0–36.0)
MCV: 93.4 fL (ref 80.0–100.0)
Platelets: 212 K/uL (ref 150–400)
RBC: 4.12 MIL/uL (ref 3.87–5.11)
RDW: 12.9 % (ref 11.5–15.5)
WBC: 10.8 K/uL — ABNORMAL HIGH (ref 4.0–10.5)
nRBC: 0 % (ref 0.0–0.2)

## 2024-05-08 LAB — BASIC METABOLIC PANEL WITH GFR
Anion gap: 11 (ref 5–15)
BUN: 13 mg/dL (ref 8–23)
CO2: 26 mmol/L (ref 22–32)
Calcium: 9.1 mg/dL (ref 8.9–10.3)
Chloride: 100 mmol/L (ref 98–111)
Creatinine, Ser: 1 mg/dL (ref 0.44–1.00)
GFR, Estimated: >60 mL/min (ref >60)
Glucose, Bld: 98 mg/dL (ref 70–99)
Potassium: 4.1 mmol/L (ref 3.5–5.1)
Sodium: 137 mmol/L (ref 135–145)

## 2024-05-08 LAB — GLUCOSE, CAPILLARY
Glucose-Capillary: 133 mg/dL — ABNORMAL HIGH (ref 70–99)
Glucose-Capillary: 105 mg/dL — ABNORMAL HIGH (ref 70–99)

## 2024-05-08 LAB — ECHOCARDIOGRAM COMPLETE
AR max vel: 3.11 cm2
AV Area VTI: 4.45 cm2
AV Area mean vel: 3.22 cm2
AV Mean grad: 2 mmHg
AV Peak grad: 5 mmHg
Ao pk vel: 1.12 m/s
Area-P 1/2: 2.83 cm2
Height: 64 in
MV VTI: 2.8 cm2
S' Lateral: 3.1 cm
Weight: 2672 [oz_av]

## 2024-05-08 LAB — LAMOTRIGINE LEVEL: Lamotrigine Lvl: 13.1 ug/mL (ref 2.0–20.0)

## 2024-05-08 MED ORDER — INFLUENZA VIRUS VACC SPLIT PF (FLUZONE) 0.5 ML IM SUSY: 0.5000 mL | PREFILLED_SYRINGE | INTRAMUSCULAR | Status: DC

## 2024-05-08 MED ORDER — POLYETHYLENE GLYCOL 3350 17 G PO PACK
17.0000 g | PACK | Freq: Every day | ORAL | Status: DC
  Administered 2024-05-08: 17 g via ORAL
  Filled 2024-05-08 (×2): qty 1

## 2024-05-08 MED ORDER — LAMOTRIGINE 100 MG PO TABS
400.0000 mg | ORAL_TABLET | Freq: Every day | ORAL | Status: DC
  Administered 2024-05-08 – 2024-05-09 (×2): 400 mg via ORAL
  Filled 2024-05-08 (×2): qty 4

## 2024-05-08 MED ORDER — ATORVASTATIN CALCIUM 20 MG PO TABS
20.0000 mg | ORAL_TABLET | Freq: Every day | ORAL | Status: DC
  Administered 2024-05-08: 20 mg via ORAL
  Filled 2024-05-08: qty 1

## 2024-05-08 MED ORDER — PNEUMOCOCCAL 20-VAL CONJ VACC 0.5 ML IM SUSY
0.5000 mL | PREFILLED_SYRINGE | INTRAMUSCULAR | Status: DC
  Filled 2024-05-08: qty 0.5

## 2024-05-08 NOTE — Progress Notes (Signed)
 [facility] CLINIC CARDIOLOGY PROGRESS NOTE       Patient ID: Monica Curtis MRN: [id number] DOB/AGE: 02/20/1961 63 y.o.  Admit date: 05/07/2024 Referring Physician Dr. Drue Potter Primary Physician Glover Lenis, MD  Primary Cardiologist None Reason for Consultation QT prolongation  HPI: Monica Curtis is a 63 y.o. female  with a past medical history of schizoaffective disorder, hypertension, diabetes, hyperlipidemia who presented to the ED on 05/07/2024 for panic attack. QT noted to be prolonged on EKG. Cardiology was consulted for further evaluation.   Interval history: -Patient seen and examined this AM, resting in bed.  -QT improved on repeat EKG today. Echo without abnormality.  -BP and HR stable.   Review of systems complete and found to be negative unless listed above    Past Medical History:  Diagnosis Date   Depression    Diabetes mellitus without complication (HCC)    Hyperlipidemia    Hypertension     Past Surgical History:  Procedure Laterality Date   ABDOMINAL HYSTERECTOMY     CESAREAN SECTION     COLONOSCOPY     COLONOSCOPY WITH PROPOFOL  N/A 01/16/2018   Procedure: COLONOSCOPY WITH PROPOFOL ;  Surgeon: Gaylyn Gladis PENNER, MD;  Location: [facility] ENDOSCOPY;  Service: Endoscopy;  Laterality: N/A;   COLONOSCOPY WITH PROPOFOL  N/A 12/23/2020   Procedure: COLONOSCOPY WITH PROPOFOL ;  Surgeon: Toledo, Ladell POUR, MD;  Location: [facility] ENDOSCOPY;  Service: Gastroenterology;  Laterality: N/A;   ESOPHAGOGASTRODUODENOSCOPY      (Not in a hospital admission)  Social History   Socioeconomic History   Marital status: Divorced    Spouse name: Not on file   Number of children: 2   Years of education: Not on file   Highest education level: Associate degree: academic program  Occupational History   Not on file  Tobacco Use   Smoking status: Never   Smokeless tobacco: Never  Vaping Use   Vaping status: Never Used  Substance and Sexual Activity   Alcohol use: Not  Currently   Drug use: Never   Sexual activity: Not Currently  Other Topics Concern   Not on file  Social History Narrative   Not on file   Social Drivers of Health   Financial Resource Strain: Low Risk  (03/20/2024)   Received from [facility]   Overall Financial Resource Strain (CARDIA)    Difficulty of Paying Living Expenses: Not hard at all  Food Insecurity: No Food Insecurity (03/20/2024)   Received from [facility]   Hunger Vital Sign    Within the past 12 months, you worried that your food would run out before you got the money to buy more.: Never true    Within the past 12 months, the food you bought just didn't last and you didn't have money to get more.: Never true  Transportation Needs: No Transportation Needs (03/20/2024)   Received from [facility] - Transportation    In the past 12 months, has lack of transportation kept you from medical appointments or from getting medications?: No    Lack of Transportation (Non-Medical): No  Physical Activity: Not on file  Stress: Not on file  Social Connections: Not on file  Intimate Partner Violence: Not on file    Family History  Problem Relation Age of Onset   Drug abuse Father    Alcohol abuse Father    Depression Sister    Breast cancer Sister    Dementia Maternal Aunt  Breast cancer Paternal Grandmother      Vitals:   05/08/24 0830 05/08/24 0900 05/08/24 0930 05/08/24 1000  BP: 102/67 134/64 (!) 105/45 110/66  Pulse: 71 69 67 67  Resp: (!) 24 (!) 24 (!) 25 (!) 25  Temp:      TempSrc:      SpO2: 100% 98% 96% 98%  Weight:      Height:        PHYSICAL EXAM General: Well appearing female, well nourished, in no acute distress. HEENT: Normocephalic and atraumatic. Neck: No JVD.  Lungs: Normal respiratory effort on room air. Clear bilaterally to auscultation. No wheezes, crackles, rhonchi.  Heart: HRRR. Normal S1 and S2 without gallops or murmurs.   Abdomen: Non-distended appearing.  Msk: Normal strength and tone for age. Extremities: Warm and well perfused. No clubbing, cyanosis. No edema.  Neuro: Alert and oriented X 3. Psych: Answers questions appropriately.   Labs: Basic Metabolic Panel: Recent Labs    05/07/24 0107 05/07/24 1007 05/08/24 0254  NA 138  --  137  K 3.8  --  4.1  CL 100  --  100  CO2 26  --  26  GLUCOSE 112*  --  98  BUN 10  --  13  CREATININE 1.10* 0.92 1.00  CALCIUM  9.2  --  9.1  MG 2.3  --   --    Liver Function Tests: No results for input(s): AST, ALT, ALKPHOS, BILITOT, PROT, ALBUMIN in the last 72 hours. No results for input(s): LIPASE, AMYLASE in the last 72 hours. CBC: Recent Labs    05/07/24 1007 05/08/24 0254  WBC 8.0 10.8*  HGB 12.8 12.5  HCT 38.7 38.5  MCV 92.6 93.4  PLT 203 212   Cardiac Enzymes: No results for input(s): CKTOTAL, CKMB, CKMBINDEX, TROPONINIHS in the last 72 hours. BNP: No results for input(s): BNP in the last 72 hours. D-Dimer: No results for input(s): DDIMER in the last 72 hours. Hemoglobin A1C: Recent Labs    05/07/24 1007  HGBA1C 5.1   Fasting Lipid Panel: No results for input(s): CHOL, HDL, LDLCALC, TRIG, CHOLHDL, LDLDIRECT in the last 72 hours. Thyroid Function Tests: No results for input(s): TSH, T4TOTAL, T3FREE, THYROIDAB in the last 72 hours.  Invalid input(s): FREET3 Anemia Panel: No results for input(s): VITAMINB12, FOLATE, FERRITIN, TIBC, IRON, RETICCTPCT in the last 72 hours.   Radiology: ECHOCARDIOGRAM COMPLETE Result Date: 05/08/2024    ECHOCARDIOGRAM REPORT   Patient Name:   Monica Curtis Date of Exam: 05/08/2024 Medical Rec #:  [id number]        Height:       64.0 in Accession #:    [id number]       Weight:       167.0 lb Date of Birth:  [birth year]        BSA:          1.812 m Patient Age:    63 years         BP:           120/57 mmHg Patient Gender: F                HR:            65 bpm. Exam Location:  [facility] Procedure: 2D Echo, Cardiac Doppler and Color Doppler (Both Spectral and Color            Flow Doppler were utilized during procedure). Indications:     Other abnormalities of the heart  R00.8  History:         Patient has no prior history of Echocardiogram examinations.                  Risk Factors:Diabetes, Hypertension and Dyslipidemia.  Sonographer:     Christopher Furnace Referring Phys:  [id number] Ebbie Sorenson Diagnosing Phys: Annalee Custovic  Sonographer Comments: Suboptimal apical window. IMPRESSIONS  1. Left ventricular ejection fraction, by estimation, is 60 to 65%. The left ventricle has normal function. The left ventricle has no regional wall motion abnormalities. Left ventricular diastolic parameters were normal.  2. Right ventricular systolic function is normal. The right ventricular size is normal.  3. The mitral valve is normal in structure. Trivial mitral valve regurgitation. No evidence of mitral stenosis.  4. The aortic valve is normal in structure. Aortic valve regurgitation is not visualized. No aortic stenosis is present.  5. The inferior vena cava is normal in size with greater than 50% respiratory variability, suggesting right atrial pressure of 3 mmHg. FINDINGS  Left Ventricle: Left ventricular ejection fraction, by estimation, is 60 to 65%. The left ventricle has normal function. The left ventricle has no regional wall motion abnormalities. The left ventricular internal cavity size was normal in size. There is  no left ventricular hypertrophy. Left ventricular diastolic parameters were normal. Right Ventricle: The right ventricular size is normal. No increase in right ventricular wall thickness. Right ventricular systolic function is normal. Left Atrium: Left atrial size was normal in size. Right Atrium: Right atrial size was normal in size. Pericardium: There is no evidence of pericardial effusion. Mitral Valve: The mitral valve is normal in structure. Trivial  mitral valve regurgitation. No evidence of mitral valve stenosis. MV peak gradient, 3.1 mmHg. The mean mitral valve gradient is 1.0 mmHg. Tricuspid Valve: The tricuspid valve is normal in structure. Tricuspid valve regurgitation is trivial. Aortic Valve: The aortic valve is normal in structure. Aortic valve regurgitation is not visualized. No aortic stenosis is present. Aortic valve mean gradient measures 2.0 mmHg. Aortic valve peak gradient measures 5.0 mmHg. Aortic valve area, by VTI measures 4.45 cm. Pulmonic Valve: The pulmonic valve was normal in structure. Pulmonic valve regurgitation is not visualized. Aorta: The aortic root is normal in size and structure. Venous: The inferior vena cava is normal in size with greater than 50% respiratory variability, suggesting right atrial pressure of 3 mmHg. IAS/Shunts: No atrial level shunt detected by color flow Doppler.  LEFT VENTRICLE PLAX 2D LVIDd:         4.30 cm   Diastology LVIDs:         3.10 cm   LV e' medial:    7.18 cm/s LV PW:         0.90 cm   LV E/e' medial:  10.0 LV IVS:        1.40 cm   LV e' lateral:   7.29 cm/s LVOT diam:     2.00 cm   LV E/e' lateral: 9.8 LV SV:         75 LV SV Index:   41 LVOT Area:     3.14 cm LV IVRT:       121 msec  RIGHT VENTRICLE RV Basal diam:  2.90 cm     PULMONARY VEINS RV Mid diam:    3.00 cm     Diastolic Velocity: 28.90 cm/s RV S prime:     12.90 cm/s  S/D Velocity:       1.00 TAPSE (M-mode): 1.9  cm      Systolic Velocity:  27.50 cm/s LEFT ATRIUM           Index        RIGHT ATRIUM          Index LA diam:      1.90 cm 1.05 cm/m   RA Area:     9.92 cm LA Vol (A2C): 34.6 ml 19.09 ml/m  RA Volume:   21.00 ml 11.59 ml/m LA Vol (A4C): 12.5 ml 6.90 ml/m  AORTIC VALVE AV Area (Vmax):    3.11 cm AV Area (Vmean):   3.22 cm AV Area (VTI):     4.45 cm AV Vmax:           112.00 cm/s AV Vmean:          72.400 cm/s AV VTI:            0.168 m AV Peak Grad:      5.0 mmHg AV Mean Grad:      2.0 mmHg LVOT Vmax:         111.00 cm/s  LVOT Vmean:        74.300 cm/s LVOT VTI:          0.238 m LVOT/AV VTI ratio: 1.42  AORTA Ao Root diam: 2.90 cm MITRAL VALVE               TRICUSPID VALVE MV Area (PHT): 2.83 cm    TR Peak grad:   8.5 mmHg MV Area VTI:   2.80 cm    TR Vmax:        146.00 cm/s MV Peak grad:  3.1 mmHg MV Mean grad:  1.0 mmHg    SHUNTS MV Vmax:       0.88 m/s    Systemic VTI:  0.24 m MV Vmean:      56.1 cm/s   Systemic Diam: 2.00 cm MV Decel Time: 268 msec MV E velocity: 71.80 cm/s MV A velocity: 91.20 cm/s MV E/A ratio:  0.79 Sabina Custovic Electronically signed by Annalee Casa Signature Date/Time: 05/08/2024/9:20:42 AM    Final    DG Chest 2 View Result Date: 05/07/2024 EXAM: PA AND LATERAL (2) VIEW(S) XRAY OF THE CHEST 05/07/2024 01:33:00 AM COMPARISON: Recent PA and lateral chest 05/01/2024. CLINICAL HISTORY: SHOB SHOB FINDINGS: LUNGS AND PLEURA: No focal pulmonary opacity. No pleural effusion. No pneumothorax. HEART AND MEDIASTINUM: No acute abnormality of the cardiac and mediastinal silhouettes. BONES AND SOFT TISSUES: No acute osseous abnormality. IMPRESSION: 1. No acute cardiopulmonary process. Stable chest. Electronically signed by: Francis Quam MD 05/07/2024 01:37 AM EST RP Workstation: [id number]   DG Chest 2 View Result Date: 05/01/2024 EXAM: 2 VIEW(S) XRAY OF THE CHEST 05/01/2024 12:34:03 PM COMPARISON: Comparison 04/12/2024. CLINICAL HISTORY: bradycardia FINDINGS: LUNGS AND PLEURA: No focal pulmonary opacity. No pleural effusion. No pneumothorax. HEART AND MEDIASTINUM: No acute abnormality of the cardiac and mediastinal silhouettes. BONES AND SOFT TISSUES: No acute osseous abnormality. IMPRESSION: 1. No acute cardiopulmonary process. Electronically signed by: Lynwood Seip MD 05/01/2024 12:59 PM EST RP Workstation: [id number]   DG Chest 2 View Result Date: 04/12/2024 EXAM: 2 VIEW(S) XRAY OF THE CHEST 04/12/2024 01:17:34 AM COMPARISON: None available. CLINICAL HISTORY: SHOB SHOB FINDINGS: LUNGS AND PLEURA:  No focal pulmonary opacity. No pulmonary edema. No pleural effusion. No pneumothorax. HEART AND MEDIASTINUM: Atherosclerotic plaque. BONES AND SOFT TISSUES: No acute osseous abnormality. IMPRESSION: 1. No acute process. Electronically signed by: Morgane Naveau MD 04/12/2024 01:24 AM EDT RP Workstation:  [id number]    ECHO as above  TELEMETRY (personally reviewed): not on tele  EKG (personally reviewed): NSR rate 73 bpm, QTc 570 msec  Data reviewed by me 05/08/2024: last 24h vitals tele labs imaging I/O ED provider note, admission H&P, psychiatry notes, hospitalist progress notes  Principal Problem:   QT prolongation    ASSESSMENT AND PLAN:  Monica Curtis is a 63 y.o. female  with a past medical history of schizoaffective disorder, hypertension, diabetes, hyperlipidemia who presented to the ED on 05/07/2024 for panic attack. QT noted to be prolonged on EKG. Cardiology was consulted for further evaluation.   # QT prolongation  # Schizoaffective disorder # Hypertension # Hyperlipidemia Patient with hx of schizoaffective disorder on multiple medications presented after panic attack. QT noted to be long on EKG. Psychiatry following and adjusting medications. Echo this admission with preserved EF, no significant abnormality. -Further management of medications per psychiatry. Cardiac cause of QT prolongation is unlikely as this has been normal on all prior EKGs and is now improved off of psych meds.  -Continue amlodipine  5 mg daily.  -Continue simvastatin  40 mg daily.   Cardiology will sign off. Please haiku with questions or re-engage if needed.   This patient's plan of care was discussed and created with Dr. Custovic and she is in agreement.  Signed: Danita Bloch, PA-C  05/08/2024, 10:35 AM [facility]

## 2024-05-08 NOTE — Evaluation (Addendum)
 Occupational Therapy Evaluation Patient Details Name: Monica Curtis MRN: [id number] DOB: 1960-12-30 Today's Date: 05/08/2024   History of Present Illness   63 y.o. female with medical history significant of hypertension, diabetes, schizoaffective disorder, hyperlipidemia who presents to the emergency room with complaints of shortness of breath.  According to patient she was otherwise well until around 10 PM last night as well as sitting down watching TV she started experiencing some shortness of breath with associated anxiety.  She denies nausea vomiting chest pain cough abdominal pain.     Clinical Impressions Upon entering the room,pt supine in bed and agreeable to OT evaluation. Pt reports living at home alone and is Ind at baseline with self care tasks. She does not drive and her son assist her with getting to appointments and she uses uber as needed. Pt performs bed mobility without assistance and dons B shoes and ties them without assistance. Pt stands and ambulates 500' + without use of AD and without use of RW. Pt returning back to room in same manner and doffs shoes to return to bed independently. OT did recommend pt utilize her pillbox or have her son assist her with pillbox at discharge.Call bell and all needed items within reach. Pt with no skilled acute OT needs at this time.      If plan is discharge home, recommend the following:   Direct supervision/assist for medications management     Functional Status Assessment   Patient has not had a recent decline in their functional status     Equipment Recommendations   None recommended by OT      Precautions/Restrictions   Precautions Precautions: Fall     Mobility Bed Mobility Overal bed mobility: Independent                  Transfers Overall transfer level: Independent Equipment used: None                      Balance Overall balance assessment: Independent                                          ADL either performed or assessed with clinical judgement   ADL Overall ADL's : Independent                                             Vision Patient Visual Report: No change from baseline              Pertinent Vitals/Pain Pain Assessment Pain Assessment: No/denies pain     Extremity/Trunk Assessment Upper Extremity Assessment Upper Extremity Assessment: Overall WFL for tasks assessed   Lower Extremity Assessment Lower Extremity Assessment: Overall WFL for tasks assessed   Cervical / Trunk Assessment Cervical / Trunk Assessment: Normal   Communication Communication Communication: No apparent difficulties   Cognition Arousal: Alert Behavior During Therapy: WFL for tasks assessed/performed Cognition: No apparent impairments                               Following commands: Intact                  Home Living Family/patient expects to be discharged to:: Private  residence Living Arrangements: Alone Available Help at Discharge: Family;Available PRN/intermittently Type of Home: Apartment Home Access: Level entry     Home Layout: One level     Bathroom Shower/Tub: Walk-in shower         Home Equipment: Grab bars - toilet;Grab bars - tub/shower          Prior Functioning/Environment Prior Level of Function : Independent/Modified Independent               ADLs Comments: Pt does not drive but uses uber or son comes and takes her to appointments or to get groceries            OT Goals(Current goals can be found in the care plan section)   Acute Rehab OT Goals Patient Stated Goal: to go home OT Goal Formulation: With patient Time For Goal Achievement: 05/08/24 Potential to Achieve Goals: Fair   AM-PAC OT 6 Clicks Daily Activity     Outcome Measure Help from another person eating meals?: None Help from another person taking care of personal grooming?: None Help from  another person toileting, which includes using toliet, bedpan, or urinal?: None Help from another person bathing (including washing, rinsing, drying)?: None Help from another person to put on and taking off regular upper body clothing?: None Help from another person to put on and taking off regular lower body clothing?: None 6 Click Score: 24   End of Session    Activity Tolerance: Patient tolerated treatment well Patient left: in bed                   Time: 8594-8581 OT Time Calculation (min): 13 min Charges:  OT General Charges $OT Visit: 1 Visit OT Evaluation $OT Eval Low Complexity: 1 Low OT Treatments $Self Care/Home Management : 8-22 mins  Izetta Claude, MS, OTR/L , CBIS ascom [phone]  05/08/24, 2:51 PM

## 2024-05-08 NOTE — Progress Notes (Signed)
 Approximately 1800--Pt arrived to room 235 from ED. Upon arrival, telemetry connected and notified. VS obtained, full assessment completed by this RN. Fall precautions in place. Pt oriented to room and unit.

## 2024-05-08 NOTE — Progress Notes (Signed)
 PROGRESS NOTE    Monica Curtis  FMW:[id number] DOB: 1961-03-16 DOA: 05/07/2024 PCP: Glover Lenis, MD  Chief Complaint  Patient presents with   Panic Attack    Hospital Course:  FAREN FLORENCE is a 63 y.o. female with medical history significant of hypertension, diabetes, schizoaffective disorder, hyperlipidemia who presents to the emergency room with complaints of shortness of breath, with associated anxiety.  Suspect had a panic attack, further workup in the ED revealed QTc prolonged to 639.  Admitted for further management  Subjective: Patient was examined at bedside, new to me today. Denies any complaints today Discussed plan of care with cardiology and psychiatry   Objective: Vitals:   05/08/24 0830 05/08/24 0900 05/08/24 0930 05/08/24 1000  BP: 102/67 134/64 (!) 105/45 110/66  Pulse: 71 69 67 67  Resp: (!) 24 (!) 24 (!) 25 (!) 25  Temp:      TempSrc:      SpO2: 100% 98% 96% 98%  Weight:      Height:       No intake or output data in the 24 hours ending 05/08/24 1042 Filed Weights   05/07/24 0057  Weight: 75.8 kg    Examination: Gen: NAD CV: S1, S2 present no murmur head Resp: Clear to auscultation bilaterally Abd: Nontender MSK: No edema, moving all extremities equally Neuro: Alert and oriented x 4 Psych: Pleasant mood  Assessment & Plan:  Prolonged Qtc - Patient presented with QTc of 639, repeat QTc 11/26 AM 423 - Echo with preserved EF, no significant abnormality - Seen by cardiology, appreciate recs.  Cardiac cause of QTc prolongation unlikely as it has been normal on all prior EKGs and now improved off psych meds - Seen by psych, appreciate recs.  On lamotrigine , clonazepam , all other meds held - Does not meet criteria for inpatient psych admission - Telemetry monitoring  Schizoaffective disorder - as above   Essential hypertension - Continue amlodipine    Diabetes mellitus type 2 - A1c was 5.5, 6 months ago - Have been placed on  sliding scale insulin  only as needed.   Hyperlipidemia - Change simvastatin  to atorvastatin  due to risk of rhabdomyolysis  -- PT/OT eval  OT recommend patient utilize her pillbox or have her son assist with her pillbox at discharge  DVT prophylaxis: Lovenox  SQ   Code Status: Full Code Disposition:  Home  Consultants:  Cardiology Psychiatry  Procedures:  None  Antimicrobials:  Anti-infectives (From admission, onward)    None       Data Reviewed: I have personally reviewed following labs and imaging studies CBC: Recent Labs  Lab 05/01/24 1150 05/07/24 0107 05/07/24 1007 05/08/24 0254  WBC 8.2 8.9 8.0 10.8*  HGB 12.9 12.6 12.8 12.5  HCT 40.5 38.9 38.7 38.5  MCV 94.2 93.3 92.6 93.4  PLT 222 208 203 212   Basic Metabolic Panel: Recent Labs  Lab 05/01/24 1150 05/07/24 0107 05/07/24 1007 05/08/24 0254  NA 139 138  --  137  K 4.7 3.8  --  4.1  CL 99 100  --  100  CO2 30 26  --  26  GLUCOSE 121* 112*  --  98  BUN 14 10  --  13  CREATININE 1.13* 1.10* 0.92 1.00  CALCIUM  9.3 9.2  --  9.1  MG  --  2.3  --   --    GFR: Estimated Creatinine Clearance: 58.1 mL/min (by C-G formula based on SCr of 1 mg/dL). Liver Function Tests: No results for  input(s): AST, ALT, ALKPHOS, BILITOT, PROT, ALBUMIN in the last 168 hours. CBG: Recent Labs  Lab 05/07/24 1213 05/07/24 1624 05/08/24 0251 05/08/24 0759  GLUCAP 102* 92 104* 112*    No results found for this or any previous visit (from the past 240 hours).   Radiology Studies: ECHOCARDIOGRAM COMPLETE Result Date: 05/08/2024    ECHOCARDIOGRAM REPORT   Patient Name:   Monica Curtis Date of Exam: 05/08/2024 Medical Rec #:  [id number]        Height:       64.0 in Accession #:    [id number]       Weight:       167.0 lb Date of Birth:  09-29-1960        BSA:          1.812 m Patient Age:    63 years         BP:           120/57 mmHg Patient Gender: F                HR:           65 bpm. Exam Location:  [facility]  Procedure: 2D Echo, Cardiac Doppler and Color Doppler (Both Spectral and Color            Flow Doppler were utilized during procedure). Indications:     Other abnormalities of the heart R00.8  History:         Patient has no prior history of Echocardiogram examinations.                  Risk Factors:Diabetes, Hypertension and Dyslipidemia.  Sonographer:     Christopher Furnace Referring Phys:  [id number] CARALYN HUDSON Diagnosing Phys: Annalee Custovic  Sonographer Comments: Suboptimal apical window. IMPRESSIONS  1. Left ventricular ejection fraction, by estimation, is 60 to 65%. The left ventricle has normal function. The left ventricle has no regional wall motion abnormalities. Left ventricular diastolic parameters were normal.  2. Right ventricular systolic function is normal. The right ventricular size is normal.  3. The mitral valve is normal in structure. Trivial mitral valve regurgitation. No evidence of mitral stenosis.  4. The aortic valve is normal in structure. Aortic valve regurgitation is not visualized. No aortic stenosis is present.  5. The inferior vena cava is normal in size with greater than 50% respiratory variability, suggesting right atrial pressure of 3 mmHg. FINDINGS  Left Ventricle: Left ventricular ejection fraction, by estimation, is 60 to 65%. The left ventricle has normal function. The left ventricle has no regional wall motion abnormalities. The left ventricular internal cavity size was normal in size. There is  no left ventricular hypertrophy. Left ventricular diastolic parameters were normal. Right Ventricle: The right ventricular size is normal. No increase in right ventricular wall thickness. Right ventricular systolic function is normal. Left Atrium: Left atrial size was normal in size. Right Atrium: Right atrial size was normal in size. Pericardium: There is no evidence of pericardial effusion. Mitral Valve: The mitral valve is normal in structure. Trivial mitral valve regurgitation. No  evidence of mitral valve stenosis. MV peak gradient, 3.1 mmHg. The mean mitral valve gradient is 1.0 mmHg. Tricuspid Valve: The tricuspid valve is normal in structure. Tricuspid valve regurgitation is trivial. Aortic Valve: The aortic valve is normal in structure. Aortic valve regurgitation is not visualized. No aortic stenosis is present. Aortic valve mean gradient measures 2.0 mmHg. Aortic valve peak gradient measures 5.0 mmHg. Aortic valve  area, by VTI measures 4.45 cm. Pulmonic Valve: The pulmonic valve was normal in structure. Pulmonic valve regurgitation is not visualized. Aorta: The aortic root is normal in size and structure. Venous: The inferior vena cava is normal in size with greater than 50% respiratory variability, suggesting right atrial pressure of 3 mmHg. IAS/Shunts: No atrial level shunt detected by color flow Doppler.  LEFT VENTRICLE PLAX 2D LVIDd:         4.30 cm   Diastology LVIDs:         3.10 cm   LV e' medial:    7.18 cm/s LV PW:         0.90 cm   LV E/e' medial:  10.0 LV IVS:        1.40 cm   LV e' lateral:   7.29 cm/s LVOT diam:     2.00 cm   LV E/e' lateral: 9.8 LV SV:         75 LV SV Index:   41 LVOT Area:     3.14 cm LV IVRT:       121 msec  RIGHT VENTRICLE RV Basal diam:  2.90 cm     PULMONARY VEINS RV Mid diam:    3.00 cm     Diastolic Velocity: 28.90 cm/s RV S prime:     12.90 cm/s  S/D Velocity:       1.00 TAPSE (M-mode): 1.9 cm      Systolic Velocity:  27.50 cm/s LEFT ATRIUM           Index        RIGHT ATRIUM          Index LA diam:      1.90 cm 1.05 cm/m   RA Area:     9.92 cm LA Vol (A2C): 34.6 ml 19.09 ml/m  RA Volume:   21.00 ml 11.59 ml/m LA Vol (A4C): 12.5 ml 6.90 ml/m  AORTIC VALVE AV Area (Vmax):    3.11 cm AV Area (Vmean):   3.22 cm AV Area (VTI):     4.45 cm AV Vmax:           112.00 cm/s AV Vmean:          72.400 cm/s AV VTI:            0.168 m AV Peak Grad:      5.0 mmHg AV Mean Grad:      2.0 mmHg LVOT Vmax:         111.00 cm/s LVOT Vmean:        74.300 cm/s  LVOT VTI:          0.238 m LVOT/AV VTI ratio: 1.42  AORTA Ao Root diam: 2.90 cm MITRAL VALVE               TRICUSPID VALVE MV Area (PHT): 2.83 cm    TR Peak grad:   8.5 mmHg MV Area VTI:   2.80 cm    TR Vmax:        146.00 cm/s MV Peak grad:  3.1 mmHg MV Mean grad:  1.0 mmHg    SHUNTS MV Vmax:       0.88 m/s    Systemic VTI:  0.24 m MV Vmean:      56.1 cm/s   Systemic Diam: 2.00 cm MV Decel Time: 268 msec MV E velocity: 71.80 cm/s MV A velocity: 91.20 cm/s MV E/A ratio:  0.79 Designer, Multimedia signed by Annalee Casa Signature Date/Time:  05/08/2024/9:20:42 AM    Final    DG Chest 2 View Result Date: 05/07/2024 EXAM: PA AND LATERAL (2) VIEW(S) XRAY OF THE CHEST 05/07/2024 01:33:00 AM COMPARISON: Recent PA and lateral chest 05/01/2024. CLINICAL HISTORY: SHOB SHOB FINDINGS: LUNGS AND PLEURA: No focal pulmonary opacity. No pleural effusion. No pneumothorax. HEART AND MEDIASTINUM: No acute abnormality of the cardiac and mediastinal silhouettes. BONES AND SOFT TISSUES: No acute osseous abnormality. IMPRESSION: 1. No acute cardiopulmonary process. Stable chest. Electronically signed by: Francis Quam MD 05/07/2024 01:37 AM EST RP Workstation: [id number]    Scheduled Meds:  amLODipine   5 mg Oral Daily   enoxaparin  (LOVENOX ) injection  40 mg Subcutaneous Q24H   insulin  aspart  0-15 Units Subcutaneous TID WC   lamoTRIgine   400 mg Oral Daily   paliperidone   234 mg Intramuscular Q28 days   simvastatin   40 mg Oral QHS   Continuous Infusions:   LOS: 0 days  MDM: Patient is high risk for one or more organ failure.  They necessitate ongoing hospitalization for continued IV therapies and subsequent lab monitoring. Total time spent interpreting labs and vitals, reviewing the medical record, coordinating care amongst consultants and care team members, directly assessing and discussing care with the patient and/or family: 55 min Laree Lock, MD [facility]  To contact the attending  physician between 7A-7P please use Epic Chat. To contact the covering physician during after hours 7P-7A, please review Amion.  05/08/2024, 10:42 AM   *This document has been created with the assistance of dictation software. Please excuse typographical errors. *

## 2024-05-08 NOTE — Progress Notes (Signed)
*  PRELIMINARY RESULTS* Echocardiogram 2D Echocardiogram has been performed.  Monica Curtis 05/08/2024, 8:50 AM

## 2024-05-08 NOTE — Progress Notes (Signed)
 PT Cancellation Note  Patient Details Name: Monica Curtis MRN: [id number] DOB: 02/22/1961   Cancelled Treatment:    Reason Eval/Treat Not Completed: PT screened, no needs identified, will sign off PT orders received. Per OT, pt is independent & does not require acute PT services. PT to complete current orders at this time. Please re-consult if new needs arise.  Richerd Pinal, PT, DPT 05/08/24, 2:49 PM   [name] 05/08/2024, 2:48 PM

## 2024-05-08 NOTE — Plan of Care (Signed)
  Problem: Education: Goal: Knowledge of General Education information will improve Description: Including pain rating scale, medication(s)/side effects and non-pharmacologic comfort measures Outcome: Progressing   Problem: Clinical Measurements: Goal: Will remain free from infection Outcome: Progressing   Problem: Clinical Measurements: Goal: Respiratory complications will improve Outcome: Progressing   Problem: Clinical Measurements: Goal: Cardiovascular complication will be avoided Outcome: Progressing   Problem: Activity: Goal: Risk for activity intolerance will decrease Outcome: Progressing   Problem: Pain Managment: Goal: General experience of comfort will improve and/or be controlled Outcome: Progressing   Problem: Safety: Goal: Ability to remain free from injury will improve Outcome: Progressing

## 2024-05-08 NOTE — Progress Notes (Signed)
*  PRELIMINARY RESULTS* Echocardiogram 2D Echocardiogram has been performed.  Monica Curtis 05/08/2024, 8:51 AM

## 2024-05-08 NOTE — Consult Note (Signed)
 [facility] Psychiatric Consult Follow Up  Patient Name: .Monica Curtis  MRN: [id number]  DOB: 06/26/1960  Consult Order details:  Orders (From admission, onward)     Start     Ordered   05/07/24 0624  IP CONSULT TO PSYCHIATRY       Ordering Provider: Ernest Ronal BRAVO, MD  Provider:  (Not yet assigned)  Question:  Reason for consult:  Answer:  Medication management   05/07/24 0624             Mode of Visit: In person    Psychiatry Consult Evaluation  Service Date: May 08, 2024 LOS:  LOS: 0 days  Chief Complaint They told me there was something wrong with my heart  Primary Psychiatric Diagnoses  QT prolongation Noted history of schizoaffective disorder, anxiety, tardive dyskinesia, cognitive impairment listed in patient most recent outpatient visit   Assessment   Monica Curtis is a 63 y.o. female admitted: Medicallyfor 05/07/2024  5:47 AM for concerns for shortness of breath and noted qtc prolongation. She carries the psychiatric diagnoses of schizoaffective disorder, anxiety, tardive dyskinesia, cognitive impairment and has a past medical history of  bradycardia.   Psychiatry was consulted due to noted QTc prolongation and patient being on multiple psychotropic medications.  On exam today, patient noted to be poor historian.  Patient denied current suicidal or homicidal ideations.  Patient denied current auditory or visual hallucinations as well.  Patient preferred for psychiatry team to reach out to her outpatient provider Dr. Vickey as well as her son Monica Curtis for further collateral information. On current presentation there was no evidence of psychosis or mania and patient did not appear to be responding to internal stimuli. At this time, patient does not appear to be a risk to self or others. Psychiatry team spoke with patient's outpatient psych provider as well as her son. Per Dr. Vickey and son, patient has been on invega  injections for many years (10 years son  believes) and actually missed her last injection that was supposed to be on 04/30/2024 due to bradycardia, so she has not had this medication since October. Patient is noted to be poor historian with this provider and could not tell me what her current medication regimen was supposed to be. It is noted on last outpatient visit, they were working to transition her antidepressant. Patient is managing her own medications at home, per son, but he also has concerns of her ability to do so. Patient reported she often forgets if she had already taken the medication or if she took enough/too much. There is concern that patient may have accidentally over medicated herself or took the wrong medications accidentally due to so many recent medication changes and the acute change in her qtc.  It is noted that on previous visit on 05/01/2024 patient's QTc was 414.  Patient's QTc today was noted to be 639 on most recent EKG.  Patient denied S/I attempt and son was adamant he did not think mother intentionally took too many of her medications, but he does worry about her ability to safely care for self due to cognitive concerns.  Her son did also report that his aunt told him today that this prolonged qtc runs in their family.  Psychiatry will continue to round on patient while medically admitted and assess medical team with further psychotropic medication management.  We did recommend to medical team that cardiology be involved due to family history of prolonged QTc.  05/08/2024: Patient was seen  on rounds today by psychiatry.  Patient reported feeling better since initial presentation to the emergency room.  Repeat EKG was performed today on 05/08/2024 and QTc was noted to be 423.  On exam today, patient denied suicidal or homicidal ideations.  Patient denied any auditory or visual hallucinations as well.On current presentation, there was no evidence of psychosis or mania and patient did not appear to be responding to internal  stimuli. At this time, patient does not appear to be a risk to self or others. While future psychiatric events cannot be accurately predicted, the patient does not currently require acute inpatient psychiatric care and does not currently meet [place]  involuntary commitment criteria.  As stated above, patient did report issues with memory and trouble with medication management.  Patient does live alone and was previously independently managing medications, recommended to primary medical team OT evaluation and possible home health due to this.  At this time, recommendations to continue lamotrigine  and as needed clonazepam .  We discussed these recommendations with patient and medical team.  We also discussed that we will defer further medication management to her outpatient provider.  Secure chat was sent to Dr. Vickey by this provider to inform of current recommendations.  We recommend that outpatient provider slowly reinitiate other needed psychotropic medication with close EKG monitoring.  Patient reported she has appointment scheduled in December for follow-up with this provider.  Patient did report she plans to go home with her son and stay there for a couple of days, as it is Thanksgiving.  She also reported that son helps her get to and from her doctors appointments as she cannot drive right now due to recommendations from her psychiatrist and neurologist. At this time, patient is psychiatrically stable for discharge with recommendations to follow up with already established outpatient provider for further monitoring and medication management.    Diagnoses:  Active Hospital problems: Principal Problem:   QT prolongation    Plan   ## Psychiatric Medication Recommendations:  Psychiatry added on a lamotrigine  level to blood work. Pending this, we will hold all other medications and only start patient back on her lamotrigine  and clonazepam  for the time being due to her current Qtc.  ## Medical  Decision Making Capacity: Not specifically addressed in this encounter  ## Further Work-up:   -- most recent EKG on 05/07/2024 had QtC of 639 -- Pertinent labwork reviewed earlier this admission includes: BMP, CBC, magnesium, pending lamotrigine  level   ## Disposition:--Patient does not meet criteria for inpatient psychiatric admission at this time  ## Behavioral / Environmental: - No specific recommendations at this time.     ## Safety and Observation Level:  - Based on my clinical evaluation, I estimate the patient to be at low risk of self harm in the current setting. - At this time, we recommend  routine. This decision is based on my review of the chart including patient's history and current presentation, interview of the patient, mental status examination, and consideration of suicide risk including evaluating suicidal ideation, plan, intent, suicidal or self-harm behaviors, risk factors, and protective factors. This judgment is based on our ability to directly address suicide risk, implement suicide prevention strategies, and develop a safety plan while the patient is in the clinical setting. Please contact our team if there is a concern that risk level has changed.  CSSR Risk Category:C-SSRS RISK CATEGORY: No Risk  Suicide Risk Assessment: Patient has following modifiable risk factors for suicide: current symptoms: anxiety/panic, insomnia, impulsivity,  anhedonia, hopelessness, which we are addressing by reaching out to outpatient provider via epic secure chat to update on current disposition plan. Patient currently denying anxiety/depression.  Patient has following non-modifiable or demographic risk factors for suicide: psychiatric hospitalization Patient has the following protective factors against suicide: Access to outpatient mental health care and Supportive family  Thank you for this consult request. Recommendations have been communicated to the primary team.  We will sign off at  this time.   Zelda Sharps, NP        History of Present Illness  Relevant Aspects of [facility]   Patient Report:  Psychiatry was consulted due to noted QTc prolongation and patient being on multiple psychotropic medications.  On exam today, patient noted to be poor historian.  Patient denied current suicidal or homicidal ideations.  Patient denied current auditory or visual hallucinations as well.  Patient preferred for psychiatry team to reach out to her outpatient provider Dr. Vickey as well as her son Monica Curtis for further collateral information. On current presentation there was no evidence of psychosis or mania and patient did not appear to be responding to internal stimuli. At this time, patient does not appear to be a risk to self or others. Psychiatry team spoke with patient's outpatient psych provider as well as her son. Per Dr. Vickey and son, patient has been on invega  injections for many years (10 years son believes) and actually missed her last injection that was supposed to be on 04/30/2024 due to bradycardia, so she has not had this medication since October. Patient is noted to be poor historian with this provider and could not tell me what her current medication regimen was supposed to be. It is noted on last outpatient visit, they were working to transition her antidepressant. Patient is managing her own medications at home, per son, but he also has concerns of her ability to do so. Patient reported she often forgets if she had already taken the medication or if she took enough/too much. There is concern that patient may have accidentally over medicated herself or took the wrong medications accidentally due to so many recent medication changes and the acute change in her qtc.  It is noted that on previous visit on 05/01/2024 patient's QTc was 414.  Patient's QTc today was noted to be 639 on most recent EKG.  Patient denied S/I attempt and son was adamant he did not think mother  intentionally took too many of her medications, but he does worry about her ability to safely care for self due to cognitive concerns.  Her son did also report that his aunt told him today that this prolonged qtc runs in their family.  Psychiatry will continue to round on patient while medically admitted and assess medical team with further psychotropic medication management.  We did recommend to medical team that cardiology be involved due to family history of prolonged QTc.  Patient's son, Monica Curtis, provided most of information due to patient being poor historian.  Monica Curtis reported that patient was recently evaluated by neurologist per request from her outpatient psychiatrist due to both Monica Curtis and her psychiatrist concern with patient's current cognitive status.  Monica Curtis voiced concern about patient's ability to safely manage her own medications.  Patient did report that she is post to be using a pill organizer to take her medications, however recently she has just been taking the medications out of the bottle.  She was unable to tell this provider the amounts of each medication she  was supposed to be taking or her current medication regimen.  When this provider mentioned the medication Wellbutrin , patient appeared confused and could not tell this provider if she was supposed to be taking this medication or not.  Per outpatient psychiatrist, patient is currently supposed to be on Lexapro , clonazepam , Wellbutrin , lamotrigine , and monthly Invega  injections.  Recently, patient was in the process of switching from Viibryd  to Lexapro .  Patient was confused and unable to discuss this titration currently.  Patient reported she believed she had switched to Lexapro , but was unsure.  Patient was able to tell this provider correct location, year, as well as month.  She was also able to report to this provider that she came to the emergency room initially for shortness of breath.  Patient did voice concerns to this  provider about potentially having to change medications in her current situation due to previous history of psychosis.  Patient reported in the past that she had seen something I was not supposed to see and got really paranoid at work that led to an episode of psychosis.  Collateral information from Monica Curtis was provided.  Monica Curtis reported that mother struggled with her mental health often when he was a young child.  He reported she was hospitalized multiple times when he was young, and he believes she has been on the Invega  injection for 10 to 15 years.  He reported she has been stable on this injection.  Monica Curtis denied any recent psychiatric hospitalizations.  Psych ROS:  Depression: Denied Anxiety: Denied Mania (lifetime and current): No current evidence Psychosis: (lifetime and current): No current evidence, patient reported history of psychosis  Collateral information:  Monica Curtis at number listed in chart on 05/07/2024    Psychiatric and Social History  Psychiatric History:  Information collected from patient, Monica Curtis, Dr. Vickey, chart review  Prev Dx/Sx: schizoaffective disorder, anxiety, tardive dyskinesia, cognitive impairment Current Psych Provider: Dr. Vickey Home Meds (current): See above Previous Med Trials: Patient unsure Therapy: Unsure  Prior Psych Hospitalization: Yes Prior Self Harm: Patient denied Prior Violence: Denied  Family Psych History: Unsure Family Hx suicide: Patient denied  Social History:    Occupational Hx: Per review of notes, patient recently quit job Legal Hx: Denied Living Situation: Alone Spiritual Hx: Unknown Access to weapons/lethal means: Denied  Substance History Alcohol: Denied Tobacco: Denied Illicit drugs: Denied Prescription drug abuse: Denied Rehab hx: Denied  Exam Findings  Physical Exam: Reviewed and agree with the physical exam findings conducted by the medical provider Vital Signs:  Temp:  [98.3 F (36.8  C)-99.1 F (37.3 C)] 99.1 F (37.3 C) (11/26 1147) Pulse Rate:  [61-71] 62 (11/26 1130) Resp:  [14-26] 22 (11/26 1130) BP: (98-145)/(45-67) 116/67 (11/26 1130) SpO2:  [96 %-100 %] 100 % (11/26 1130) Blood pressure 116/67, pulse 62, temperature 99.1 F (37.3 C), temperature source Oral, resp. rate (!) 22, height 5' 4 (1.626 m), weight 75.8 kg, SpO2 100%. Body mass index is 28.67 kg/m.    Mental Status Exam: General Appearance: Casual  Orientation:  Full (Time, Place, and Person)  Memory:  Fair  Concentration:  Concentration: Fair  Recall:  Fair  Attention  Fair  Eye Contact:  Good  Speech:  Clear and Coherent  Language:  Fair  Volume:  Normal  Mood: euthymic  Affect:  Congruent  Thought Process:  Coherent  Thought Content:  Logical  Suicidal Thoughts:  No  Homicidal Thoughts:  No  Judgement:  Impaired  Insight:  Present  Psychomotor Activity:  Normal  Akathisia:  No  Fund of Knowledge:  Fair      Assets:  Manufacturing Systems Engineer Desire for Improvement Housing Social Support  Cognition:  Impaired,  Mild  ADL's:  Intact  AIMS (if indicated):        Other History   These have been pulled in through the EMR, reviewed, and updated if appropriate.  Family History:  The patient's family history includes Alcohol abuse in her father; Breast cancer in her paternal grandmother and sister; Dementia in her maternal aunt; Depression in her sister; Drug abuse in her father.  Medical History: Past Medical History:  Diagnosis Date   Depression    Diabetes mellitus without complication (HCC)    Hyperlipidemia    Hypertension     Surgical History: Past Surgical History:  Procedure Laterality Date   ABDOMINAL HYSTERECTOMY     CESAREAN SECTION     COLONOSCOPY     COLONOSCOPY WITH PROPOFOL  N/A 01/16/2018   Procedure: COLONOSCOPY WITH PROPOFOL ;  Surgeon: Gaylyn Gladis PENNER, MD;  Location: [facility] ENDOSCOPY;  Service: Endoscopy;  Laterality: N/A;   COLONOSCOPY WITH PROPOFOL  N/A  12/23/2020   Procedure: COLONOSCOPY WITH PROPOFOL ;  Surgeon: Toledo, Ladell POUR, MD;  Location: [facility] ENDOSCOPY;  Service: Gastroenterology;  Laterality: N/A;   ESOPHAGOGASTRODUODENOSCOPY       Medications:   Current Facility-Administered Medications:    amLODipine  (NORVASC ) tablet 5 mg, 5 mg, Oral, Daily, Djan, Drue DASEN, MD, 5 mg at 05/08/24 9041   atorvastatin  (LIPITOR) tablet 20 mg, 20 mg, Oral, Daily, Ponnala, Shruthi, MD   clonazepam  (KLONOPIN ) disintegrating tablet 0.25 mg, 0.25 mg, Oral, QHS PRN, Cleatus Delayne GAILS, MD, 0.25 mg at 05/07/24 2203   enoxaparin  (LOVENOX ) injection 40 mg, 40 mg, Subcutaneous, Q24H, Djan, Prince T, MD, 40 mg at 05/07/24 2203   insulin  aspart (novoLOG ) injection 0-15 Units, 0-15 Units, Subcutaneous, TID WC, Djan, Drue DASEN, MD   lamoTRIgine  (LAMICTAL ) tablet 400 mg, 400 mg, Oral, Daily, Ahmod Gillespie B, NP, 400 mg at 05/08/24 1109   paliperidone  (INVEGA  SUSTENNA) injection 234 mg, 234 mg, Intramuscular, Q28 days, , 234 mg at 04/03/24 1138  Current Outpatient Medications:    buPROPion  (WELLBUTRIN  XL) 150 MG 24 hr tablet, Take 1 tablet (150 mg total) by mouth daily. Takes a total of 450 mg daily. Take along with 300 mg tab, Disp: 90 tablet, Rfl: 1   buPROPion  (WELLBUTRIN  XL) 300 MG 24 hr tablet, Take 1 tablet (300 mg total) by mouth daily. Total of 450 mg daily. Take along with 150 mg tab, Disp: 90 tablet, Rfl: 1   cabergoline (DOSTINEX) 0.5 MG tablet, Take 0.5 mg by mouth 2 (two) times a week., Disp: , Rfl:    cetirizine (ZYRTEC) 10 MG tablet, Take 10 mg by mouth daily., Disp: , Rfl:    [START ON 05/10/2024] clonazePAM  (KLONOPIN ) 0.25 MG disintegrating tablet, Take 1 tablet (0.25 mg total) by mouth at bedtime as needed (anxiety)., Disp: 30 tablet, Rfl: 0   escitalopram  (LEXAPRO ) 20 MG tablet, 10 mg daily for one week, then 20 mg daily, Disp: 30 tablet, Rfl: 1   lamoTRIgine  (LAMICTAL ) 200 MG tablet, Take 2 tablets (400 mg total) by mouth daily., Disp: 180 tablet, Rfl:  1   paliperidone  (INVEGA  SUSTENNA) 234 MG/1.5ML injection, Inject 234 mg into the muscle every 28 (twenty-eight) days for 6 doses., Disp: 1.5 mL, Rfl: 5   propranolol ER (INDERAL LA) 60 MG 24 hr capsule, Take 60 mg by mouth daily., Disp: , Rfl:  senna-docusate (SENOKOT-S) 8.6-50 MG tablet, Take 1 tablet by mouth at bedtime as needed for mild constipation or moderate constipation., Disp: , Rfl:    simvastatin  (ZOCOR ) 40 MG tablet, Take 40 mg by mouth at bedtime., Disp: , Rfl:    Vilazodone  HCl (VIIBRYD ) 10 MG TABS, 5 mg daily for one week, then 10 mg daily, Disp: 30 tablet, Rfl: 1   ofloxacin  (FLOXIN ) 0.3 % OTIC solution, Place 10 drops into the left ear daily. (Patient not taking: Reported on 05/07/2024), Disp: 5 mL, Rfl: 0   predniSONE  (STERAPRED UNI-PAK 21 TAB) 10 MG (21) TBPK tablet, Take by mouth daily. Take 6 tabs by mouth daily  for 1 days, then 5 tabs for 1 days, then 4 tabs for 1 days, then 3 tabs for 1 days, 2 tabs for 1 days, then 1 tab by mouth daily for 1 days (Patient not taking: Reported on 05/07/2024), Disp: 21 tablet, Rfl: 0   venlafaxine  XR (EFFEXOR -XR) 150 MG 24 hr capsule, Take 1 capsule (150 mg total) by mouth daily with breakfast. Start after completing 75 mg daily for one week, Disp: 90 capsule, Rfl: 0   venlafaxine  XR (EFFEXOR -XR) 75 MG 24 hr capsule, Take 1 capsule (75 mg total) by mouth daily with breakfast for 7 days., Disp: 7 capsule, Rfl: 0  Allergies: Allergies  Allergen Reactions   Misc. Sulfonamide Containing Compounds Hives   Sulfa Antibiotics Hives    Zelda Sharps, NP This note was created using Scientist, clinical (histocompatibility and immunogenetics). Please excuse any inadvertent transcription errors. Case was discussed with supervising physician Dr. Jadapalle who is agreeable with current plan.

## 2024-05-09 DIAGNOSIS — R9431 Abnormal electrocardiogram [ECG] [EKG]: Secondary | ICD-10-CM | POA: Diagnosis not present

## 2024-05-09 LAB — CBC
HCT: 39.5 % (ref 36.0–46.0)
Hemoglobin: 12.7 g/dL (ref 12.0–15.0)
MCH: 30.5 pg (ref 26.0–34.0)
MCHC: 32.2 g/dL (ref 30.0–36.0)
MCV: 94.7 fL (ref 80.0–100.0)
Platelets: 196 K/uL (ref 150–400)
RBC: 4.17 MIL/uL (ref 3.87–5.11)
RDW: 12.7 % (ref 11.5–15.5)
WBC: 10.5 K/uL (ref 4.0–10.5)
nRBC: 0 % (ref 0.0–0.2)

## 2024-05-09 LAB — GLUCOSE, CAPILLARY
Glucose-Capillary: 104 mg/dL — ABNORMAL HIGH (ref 70–99)
Glucose-Capillary: 111 mg/dL — ABNORMAL HIGH (ref 70–99)

## 2024-05-09 MED ORDER — AMLODIPINE BESYLATE 5 MG PO TABS: 5.0000 mg | ORAL_TABLET | Freq: Every day | ORAL | 1 refills | Status: AC

## 2024-05-09 MED ORDER — ATORVASTATIN CALCIUM 20 MG PO TABS: 20.0000 mg | ORAL_TABLET | Freq: Every day | ORAL | 1 refills | Status: AC

## 2024-05-09 NOTE — Discharge Summary (Signed)
 Physician Discharge Summary   Patient: Monica Curtis MRN: [id number] DOB: 01-31-1961  Admit date:     05/07/2024  Discharge date: 05/09/24  Discharge Physician: Laree Lock   PCP: Glover Lenis, MD   Recommendations at discharge:   Follow-up with PCP outpatient -monitor HR, BP Propranolol held  Follow-up with psychiatry outpatient in 1 to 2-weeks  Discharge Diagnoses: Principal Problem:   QT prolongation   Hospital Course: Monica Curtis is a 63 y.o. female with medical history significant of hypertension, diabetes, schizoaffective disorder, hyperlipidemia who presents to the emergency room with complaints of shortness of breath, with associated anxiety.  Suspect had a panic attack, further workup in the ED revealed QTc prolonged to 639.  Admitted for further management   Assessment and Plan: Prolonged Qtc - Patient presented with QTc of 639, repeat QTc 11/26 AM 423 - Echo with preserved EF, no significant abnormality - Seen by cardiology, appreciate recs.  Cardiac cause of QTc prolongation unlikely as it has been normal on all prior EKGs and now improved off psych meds - Seen by psych, appreciate recs.  On lamotrigine , clonazepam , all other meds held   Schizoaffective disorder - Continue lamotrigine , clonazepam  - Hold Invega , venlafaxine , Wellbutrin , Lexapro , Vilazodone  - Seen by psych, appreciate recs - Does not meet criteria for inpatient psych admission - Psychiatry team has discussed with outpatient primary psychiatry, plan to see her sooner   Essential hypertension - Continue amlodipine  - Propranolol was held due to bradycardia   Hyperlipidemia - Change simvastatin  to atorvastatin  due to risk of rhabdomyolysis   -- PT/OT eval - no recs OT recommend patient utilize her pillbox or have her son assist with her pillbox at discharge Offered patient that I will talk to her son, states she will communicate and did not want me to call him    Pain control -  [place]  Controlled Substance Reporting System database was reviewed. and patient was instructed, not to drive, operate heavy machinery, perform activities at heights, swimming or participation in water activities or provide baby-sitting services while on Pain, Sleep and Anxiety Medications; until their outpatient Physician has advised to do so again. Also recommended to not to take more than prescribed Pain, Sleep and Anxiety Medications.  Consultants: Psychiatry, Cardiology Procedures performed: None  Disposition: Home Diet recommendation:  Discharge Diet Orders (From admission, onward)     Start     Ordered   05/09/24 0000  Diet - low sodium heart healthy        05/09/24 1401            DISCHARGE MEDICATION: Allergies as of 05/09/2024       Reactions   Misc. Sulfonamide Containing Compounds Hives   Sulfa Antibiotics Hives        Medication List     PAUSE taking these medications    buPROPion  150 MG 24 hr tablet Wait to take this until your doctor or other care provider tells you to start again. Commonly known as: WELLBUTRIN  XL Take 1 tablet (150 mg total) by mouth daily. Takes a total of 450 mg daily. Take along with 300 mg tab   buPROPion  300 MG 24 hr tablet Wait to take this until your doctor or other care provider tells you to start again. Commonly known as: WELLBUTRIN  XL Take 1 tablet (300 mg total) by mouth daily. Total of 450 mg daily. Take along with 150 mg tab   escitalopram  20 MG tablet Wait to take this until your doctor  or other care provider tells you to start again. Commonly known as: LEXAPRO  10 mg daily for one week, then 20 mg daily   Invega  Sustenna 234 MG/1.5ML injection Wait to take this until your doctor or other care provider tells you to start again. Generic drug: paliperidone  Inject 234 mg into the muscle every 28 (twenty-eight) days for 6 doses.   venlafaxine  XR 150 MG 24 hr capsule Wait to take this until your doctor or other care  provider tells you to start again. Commonly known as: EFFEXOR -XR Take 1 capsule (150 mg total) by mouth daily with breakfast. Start after completing 75 mg daily for one week   venlafaxine  XR 75 MG 24 hr capsule Wait to take this until your doctor or other care provider tells you to start again. Commonly known as: EFFEXOR -XR Take 1 capsule (75 mg total) by mouth daily with breakfast for 7 days.   Vilazodone  HCl 10 MG Tabs Wait to take this until your doctor or other care provider tells you to start again. Commonly known as: VIIBRYD  5 mg daily for one week, then 10 mg daily       STOP taking these medications    predniSONE  10 MG (21) Tbpk tablet Commonly known as: STERAPRED UNI-PAK 21 TAB   propranolol ER 60 MG 24 hr capsule Commonly known as: INDERAL LA   simvastatin  40 MG tablet Commonly known as: ZOCOR        TAKE these medications    amLODipine  5 MG tablet Commonly known as: NORVASC  Take 1 tablet (5 mg total) by mouth daily. Start taking on: May 10, 2024   atorvastatin  20 MG tablet Commonly known as: LIPITOR Take 1 tablet (20 mg total) by mouth daily.   cabergoline 0.5 MG tablet Commonly known as: DOSTINEX Take 0.5 mg by mouth 2 (two) times a week.   cetirizine 10 MG tablet Commonly known as: ZYRTEC Take 10 mg by mouth daily.   clonazePAM  0.25 MG disintegrating tablet Commonly known as: KLONOPIN  Take 1 tablet (0.25 mg total) by mouth at bedtime as needed (anxiety). Start taking on: May 10, 2024   lamoTRIgine  200 MG tablet Commonly known as: LAMICTAL  Take 2 tablets (400 mg total) by mouth daily.   ofloxacin  0.3 % OTIC solution Commonly known as: FLOXIN  Place 10 drops into the left ear daily.   senna-docusate 8.6-50 MG tablet Commonly known as: Senokot-S Take 1 tablet by mouth at bedtime as needed for mild constipation or moderate constipation.        Discharge Exam: Filed Weights   05/07/24 0057  Weight: 75.8 kg   Gen: NAD CV: S1,  S2 present no murmur head Resp: Clear to auscultation bilaterally Abd: Nontender MSK: No edema, moving all extremities equally Neuro: Alert and oriented x 4 Psych: Pleasant mood  Condition at discharge: good  The results of significant diagnostics from this hospitalization (including imaging, microbiology, ancillary and laboratory) are listed below for reference.   Imaging Studies: ECHOCARDIOGRAM COMPLETE Result Date: 05/08/2024    ECHOCARDIOGRAM REPORT   Patient Name:   Monica Curtis Date of Exam: 05/08/2024 Medical Rec #:  [id number]        Height:       64.0 in Accession #:    [id number]       Weight:       167.0 lb Date of Birth:  [birth year]        BSA:          1.812 m Patient Age:  63 years         BP:           120/57 mmHg Patient Gender: F                HR:           65 bpm. Exam Location:  [facility] Procedure: 2D Echo, Cardiac Doppler and Color Doppler (Both Spectral and Color            Flow Doppler were utilized during procedure). Indications:     Other abnormalities of the heart R00.8  History:         Patient has no prior history of Echocardiogram examinations.                  Risk Factors:Diabetes, Hypertension and Dyslipidemia.  Sonographer:     Christopher Furnace Referring Phys:  [id number] CARALYN HUDSON Diagnosing Phys: Annalee Custovic  Sonographer Comments: Suboptimal apical window. IMPRESSIONS  1. Left ventricular ejection fraction, by estimation, is 60 to 65%. The left ventricle has normal function. The left ventricle has no regional wall motion abnormalities. Left ventricular diastolic parameters were normal.  2. Right ventricular systolic function is normal. The right ventricular size is normal.  3. The mitral valve is normal in structure. Trivial mitral valve regurgitation. No evidence of mitral stenosis.  4. The aortic valve is normal in structure. Aortic valve regurgitation is not visualized. No aortic stenosis is present.  5. The inferior vena cava is normal in size with greater than  50% respiratory variability, suggesting right atrial pressure of 3 mmHg. FINDINGS  Left Ventricle: Left ventricular ejection fraction, by estimation, is 60 to 65%. The left ventricle has normal function. The left ventricle has no regional wall motion abnormalities. The left ventricular internal cavity size was normal in size. There is  no left ventricular hypertrophy. Left ventricular diastolic parameters were normal. Right Ventricle: The right ventricular size is normal. No increase in right ventricular wall thickness. Right ventricular systolic function is normal. Left Atrium: Left atrial size was normal in size. Right Atrium: Right atrial size was normal in size. Pericardium: There is no evidence of pericardial effusion. Mitral Valve: The mitral valve is normal in structure. Trivial mitral valve regurgitation. No evidence of mitral valve stenosis. MV peak gradient, 3.1 mmHg. The mean mitral valve gradient is 1.0 mmHg. Tricuspid Valve: The tricuspid valve is normal in structure. Tricuspid valve regurgitation is trivial. Aortic Valve: The aortic valve is normal in structure. Aortic valve regurgitation is not visualized. No aortic stenosis is present. Aortic valve mean gradient measures 2.0 mmHg. Aortic valve peak gradient measures 5.0 mmHg. Aortic valve area, by VTI measures 4.45 cm. Pulmonic Valve: The pulmonic valve was normal in structure. Pulmonic valve regurgitation is not visualized. Aorta: The aortic root is normal in size and structure. Venous: The inferior vena cava is normal in size with greater than 50% respiratory variability, suggesting right atrial pressure of 3 mmHg. IAS/Shunts: No atrial level shunt detected by color flow Doppler.  LEFT VENTRICLE PLAX 2D LVIDd:         4.30 cm   Diastology LVIDs:         3.10 cm   LV e' medial:    7.18 cm/s LV PW:         0.90 cm   LV E/e' medial:  10.0 LV IVS:        1.40 cm   LV e' lateral:   7.29 cm/s LVOT diam:  2.00 cm   LV E/e' lateral: 9.8 LV SV:          75 LV SV Index:   41 LVOT Area:     3.14 cm LV IVRT:       121 msec  RIGHT VENTRICLE RV Basal diam:  2.90 cm     PULMONARY VEINS RV Mid diam:    3.00 cm     Diastolic Velocity: 28.90 cm/s RV S prime:     12.90 cm/s  S/D Velocity:       1.00 TAPSE (M-mode): 1.9 cm      Systolic Velocity:  27.50 cm/s LEFT ATRIUM           Index        RIGHT ATRIUM          Index LA diam:      1.90 cm 1.05 cm/m   RA Area:     9.92 cm LA Vol (A2C): 34.6 ml 19.09 ml/m  RA Volume:   21.00 ml 11.59 ml/m LA Vol (A4C): 12.5 ml 6.90 ml/m  AORTIC VALVE AV Area (Vmax):    3.11 cm AV Area (Vmean):   3.22 cm AV Area (VTI):     4.45 cm AV Vmax:           112.00 cm/s AV Vmean:          72.400 cm/s AV VTI:            0.168 m AV Peak Grad:      5.0 mmHg AV Mean Grad:      2.0 mmHg LVOT Vmax:         111.00 cm/s LVOT Vmean:        74.300 cm/s LVOT VTI:          0.238 m LVOT/AV VTI ratio: 1.42  AORTA Ao Root diam: 2.90 cm MITRAL VALVE               TRICUSPID VALVE MV Area (PHT): 2.83 cm    TR Peak grad:   8.5 mmHg MV Area VTI:   2.80 cm    TR Vmax:        146.00 cm/s MV Peak grad:  3.1 mmHg MV Mean grad:  1.0 mmHg    SHUNTS MV Vmax:       0.88 m/s    Systemic VTI:  0.24 m MV Vmean:      56.1 cm/s   Systemic Diam: 2.00 cm MV Decel Time: 268 msec MV E velocity: 71.80 cm/s MV A velocity: 91.20 cm/s MV E/A ratio:  0.79 Sabina Custovic Electronically signed by Annalee Casa Signature Date/Time: 05/08/2024/9:20:42 AM    Final    DG Chest 2 View Result Date: 05/07/2024 EXAM: PA AND LATERAL (2) VIEW(S) XRAY OF THE CHEST 05/07/2024 01:33:00 AM COMPARISON: Recent PA and lateral chest 05/01/2024. CLINICAL HISTORY: SHOB SHOB FINDINGS: LUNGS AND PLEURA: No focal pulmonary opacity. No pleural effusion. No pneumothorax. HEART AND MEDIASTINUM: No acute abnormality of the cardiac and mediastinal silhouettes. BONES AND SOFT TISSUES: No acute osseous abnormality. IMPRESSION: 1. No acute cardiopulmonary process. Stable chest. Electronically signed by:  Francis Quam MD 05/07/2024 01:37 AM EST RP Workstation: [id number]   DG Chest 2 View Result Date: 05/01/2024 EXAM: 2 VIEW(S) XRAY OF THE CHEST 05/01/2024 12:34:03 PM COMPARISON: Comparison 04/12/2024. CLINICAL HISTORY: bradycardia FINDINGS: LUNGS AND PLEURA: No focal pulmonary opacity. No pleural effusion. No pneumothorax. HEART AND MEDIASTINUM: No acute abnormality of the cardiac and mediastinal silhouettes. BONES AND SOFT  TISSUES: No acute osseous abnormality. IMPRESSION: 1. No acute cardiopulmonary process. Electronically signed by: Lynwood Seip MD 05/01/2024 12:59 PM EST RP Workstation: [id number]   DG Chest 2 View Result Date: 04/12/2024 EXAM: 2 VIEW(S) XRAY OF THE CHEST 04/12/2024 01:17:34 AM COMPARISON: None available. CLINICAL HISTORY: SHOB SHOB FINDINGS: LUNGS AND PLEURA: No focal pulmonary opacity. No pulmonary edema. No pleural effusion. No pneumothorax. HEART AND MEDIASTINUM: Atherosclerotic plaque. BONES AND SOFT TISSUES: No acute osseous abnormality. IMPRESSION: 1. No acute process. Electronically signed by: Morgane Naveau MD 04/12/2024 01:24 AM EDT RP Workstation: [id number]    Microbiology: Results for orders placed or performed in visit on 07/07/20  Novel Coronavirus, NAA ([facility])     Status: None   Collection Time: 07/07/20  5:43 PM   Specimen: Nasopharyngeal(NP) swabs in vial transport medium   Nasopharynge  Screenin  Result Value Ref Range Status   SARS-CoV-2, NAA Not Detected Not Detected Final    Comment: This nucleic acid amplification test was developed and its performance characteristics determined by [employer]. Nucleic acid amplification tests include RT-PCR and TMA. This test has not been FDA cleared or approved. This test has been authorized by FDA under an Emergency Use Authorization (EUA). This test is only authorized for the duration of time the declaration that circumstances exist justifying the authorization of the emergency use of in  vitro diagnostic tests for detection of SARS-CoV-2 virus and/or diagnosis of COVID-19 infection under section 564(b)(1) of the Act, 21 U.S.C. 639aaa-6(a) (1), unless the authorization is terminated or revoked sooner. When diagnostic testing is negative, the possibility of a false negative result should be considered in the context of a patient's recent exposures and the presence of clinical signs and symptoms consistent with COVID-19. An individual without symptoms of COVID-19 and who is not shedding SARS-CoV-2 virus wo uld expect to have a negative (not detected) result in this assay.   SARS-COV-2, NAA 2 DAY TAT     Status: None   Collection Time: 07/07/20  5:43 PM   Nasopharynge  Screenin  Result Value Ref Range Status   SARS-CoV-2, NAA 2 DAY TAT Performed  Final    Labs: CBC: Recent Labs  Lab 05/07/24 0107 05/07/24 1007 05/08/24 0254 05/09/24 0333  WBC 8.9 8.0 10.8* 10.5  HGB 12.6 12.8 12.5 12.7  HCT 38.9 38.7 38.5 39.5  MCV 93.3 92.6 93.4 94.7  PLT 208 203 212 196   Basic Metabolic Panel: Recent Labs  Lab 05/07/24 0107 05/07/24 1007 05/08/24 0254  NA 138  --  137  K 3.8  --  4.1  CL 100  --  100  CO2 26  --  26  GLUCOSE 112*  --  98  BUN 10  --  13  CREATININE 1.10* 0.92 1.00  CALCIUM  9.2  --  9.1  MG 2.3  --   --    Liver Function Tests: No results for input(s): AST, ALT, ALKPHOS, BILITOT, PROT, ALBUMIN in the last 168 hours. CBG: Recent Labs  Lab 05/08/24 1145 05/08/24 1820 05/08/24 2112 05/09/24 0753 05/09/24 1140  GLUCAP 112* 133* 105* 104* 111*    Discharge time spent: greater than 30 minutes.  Signed: Laree Lock, MD [facility] 05/09/2024

## 2024-05-09 NOTE — Plan of Care (Signed)

## 2024-05-09 NOTE — Care Management Obs Status (Signed)
 MEDICARE OBSERVATION STATUS NOTIFICATION   Patient Details  Name: Monica Curtis MRN: [id number] Date of Birth: 07-01-1960   Medicare Observation Status Notification Given:  Yes    Rojelio SHAUNNA Rattler 05/09/2024, 1:39 PM

## 2024-05-09 NOTE — Progress Notes (Signed)
 PT Cancellation Note  Patient Details Name: CLEOTHA WHALIN MRN: [id number] DOB: [birth year]   Cancelled Treatment:    Reason Eval/Treat Not Completed: PT screened, no needs identified, will sign off (Repeat order received and discontinued by attending.  Patient remains at baseline; no acute PT needs.)  Jamerion Cabello H. Delores, PT, DPT, NCS 05/09/24, 9:01 AM [phone]

## 2024-05-09 NOTE — Progress Notes (Signed)
 Pt discharged to home. All PIVs removed, sites WDL. Telemetry disconnected and notified. Pt belongings taken with pt--pt verified. AVS discharge instructions provided to pt by Charge RN. Pt transported by son via personal vehicle.

## 2024-05-14 ENCOUNTER — Telehealth: Payer: Self-pay | Admitting: Psychiatry
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
## 2024-05-14 NOTE — Telephone Encounter (Signed)
 Monica Curtis called to cancel the 12/8 appt to be seen sooner. It's due to lack of transportation b/c she can't drive. She said she will have to keep her original appt of 07/01/24

## 2024-05-20 ENCOUNTER — Ambulatory Visit: Admitting: Psychiatry

## 2024-05-20 ENCOUNTER — Encounter: Payer: Self-pay | Admitting: Psychiatry

## 2024-05-20 ENCOUNTER — Other Ambulatory Visit: Payer: Self-pay

## 2024-05-20 VITALS — BP 146/84 | HR 96 | Temp 98.2°F | Ht 64.0 in | Wt 163.4 lb

## 2024-05-20 DIAGNOSIS — F411 Generalized anxiety disorder: Secondary | ICD-10-CM | POA: Diagnosis not present

## 2024-05-20 DIAGNOSIS — G2401 Drug induced subacute dyskinesia: Secondary | ICD-10-CM | POA: Diagnosis not present

## 2024-05-20 DIAGNOSIS — F251 Schizoaffective disorder, depressive type: Secondary | ICD-10-CM

## 2024-05-20 DIAGNOSIS — Z79899 Other long term (current) drug therapy: Secondary | ICD-10-CM

## 2024-05-20 MED ORDER — PALIPERIDONE PALMITATE ER 156 MG/ML IM SUSY: 156.0000 mg | PREFILLED_SYRINGE | INTRAMUSCULAR | 0 refills | Status: AC

## 2024-05-20 MED ORDER — INVEGA SUSTENNA 156 MG/ML IM SUSY: 156.0000 mg | PREFILLED_SYRINGE | Freq: Once | INTRAMUSCULAR | 0 refills | Status: AC

## 2024-05-20 NOTE — Patient Instructions (Addendum)
 Start Invega  156 mg IM, then another 156 mg one week later. Will plan to stay on 156 mg every 4 weeks afterwards Continue lamotrigine  400 mg daily  Continue clonazepam  0.25 mg ODT at night  EKG-please call [phone] to make an appointment Next appointment:  1/19 at 4:30

## 2024-05-23 ENCOUNTER — Ambulatory Visit

## 2024-05-27 ENCOUNTER — Other Ambulatory Visit: Payer: Self-pay

## 2024-05-27 ENCOUNTER — Other Ambulatory Visit: Payer: Self-pay | Admitting: Psychiatry

## 2024-05-27 ENCOUNTER — Ambulatory Visit

## 2024-05-27 VITALS — BP 120/74 | HR 88 | Temp 97.4°F | Ht 64.0 in | Wt 162.4 lb

## 2024-05-27 DIAGNOSIS — F251 Schizoaffective disorder, depressive type: Secondary | ICD-10-CM

## 2024-05-27 MED ORDER — PALIPERIDONE PALMITATE ER 156 MG/ML IM SUSY
156.0000 mg | PREFILLED_SYRINGE | INTRAMUSCULAR | Status: AC
  Administered 2024-06-03 – 2024-07-04 (×2): 156 mg via INTRAMUSCULAR

## 2024-05-27 MED ORDER — PALIPERIDONE PALMITATE ER 156 MG/ML IM SUSY
156.0000 mg | PREFILLED_SYRINGE | Freq: Once | INTRAMUSCULAR | Status: AC
  Administered 2024-05-27: 10:00:00 156 mg via INTRAMUSCULAR

## 2024-05-27 NOTE — Patient Instructions (Addendum)
 Patient present flat affect mood was pleasant and denied visual or auditory hallucinations. No suicidal or homicidal ideations, no plan, intent, or means to want to harm self or others. Patients Invega  Sustenna 156 mg mg/ml IM injection prepared as ordered and administered to patient in her right deltoid. Patient tolerated without discomfort or pain. Patient will return in 28 days and will call if there is any changes.   NDC 49541-436-98 GTIN [id number] SN [id number] EXP 08-10-2025 LOT [id number]

## 2024-05-27 NOTE — Progress Notes (Unsigned)
 Patient present flat affect mood was pleasant and denied visual or auditory hallucinations. No suicidal or homicidal ideations, no plan, intent, or means to want to harm self or others. Patients Invega  Sustenna 156 mg mg/ml IM injection prepared as ordered and administered to patient in her right deltoid. Patient tolerated without discomfort or pain. Patient will return in 28 days and will call if there is any changes.   NDC 49541-436-98 GTIN [id number] SN [id number] EXP 08-10-2025 LOT [id number]

## 2024-05-29 ENCOUNTER — Ambulatory Visit

## 2024-06-01 ENCOUNTER — Other Ambulatory Visit: Payer: Self-pay | Admitting: Psychiatry

## 2024-06-03 ENCOUNTER — Other Ambulatory Visit: Payer: Self-pay

## 2024-06-03 ENCOUNTER — Ambulatory Visit

## 2024-06-03 ENCOUNTER — Ambulatory Visit: Admitting: Psychiatry

## 2024-06-03 VITALS — BP 135/82 | HR 98 | Temp 98.0°F | Ht 64.0 in | Wt 162.0 lb

## 2024-06-03 DIAGNOSIS — F251 Schizoaffective disorder, depressive type: Secondary | ICD-10-CM | POA: Diagnosis not present

## 2024-06-03 NOTE — Patient Instructions (Signed)
 Patient present flat affect mood was pleasant and denied visual or auditory hallucinations. No suicidal or homicidal ideations, no plan, intent, or means to want to harm self or others. Patients Invega  Sustenna 156 mg mg/ml IM injection prepared as ordered and administered to patient in her left deltoid. Patient tolerated without discomfort or pain. Patient will return in 28 days and will call if there is any changes.   NDC 49541-436-98 GTIN [id number] SN [id number] EXP 06-12-2025 LOT [id number]

## 2024-06-03 NOTE — Progress Notes (Signed)
 Patient present flat affect mood was pleasant and denied visual or auditory hallucinations. No suicidal or homicidal ideations, no plan, intent, or means to want to harm self or others. Patients Invega  Sustenna 156 mg mg/ml IM injection prepared as ordered and administered to patient in her left deltoid. Patient tolerated without discomfort or pain. Patient will return in 28 days and will call if there is any changes.   NDC 49541-436-98 GTIN [id number] SN [id number] EXP 06-12-2025 LOT [id number]

## 2024-06-05 ENCOUNTER — Other Ambulatory Visit: Payer: Self-pay | Admitting: Psychiatry

## 2024-06-10 ENCOUNTER — Other Ambulatory Visit: Payer: Self-pay | Admitting: Psychiatry

## 2024-06-10 ENCOUNTER — Telehealth: Payer: Self-pay

## 2024-06-10 MED ORDER — CLONAZEPAM 0.25 MG PO TBDP: 0.2500 mg | ORAL_TABLET | Freq: Every evening | ORAL | 0 refills | Status: AC | PRN

## 2024-06-10 NOTE — Telephone Encounter (Signed)
 Pt calling stating she would like her refill of clonazePAM  (KLONOPIN ) 0.25 MG disintegrating tablet to go to her new pharmacy [facility] [address] [facility] - [place], [place] - [address] AT [address]   Pt last seen :05/20/24 Next apt on:07/01/24

## 2024-06-10 NOTE — Telephone Encounter (Signed)
 Ordered

## 2024-06-11 ENCOUNTER — Ambulatory Visit: Admitting: Psychiatry

## 2024-06-21 ENCOUNTER — Ambulatory Visit
Admission: RE | Admit: 2024-06-21 | Discharge: 2024-06-21 | Disposition: A | Source: Ambulatory Visit | Attending: Psychiatry

## 2024-06-21 DIAGNOSIS — I10 Essential (primary) hypertension: Secondary | ICD-10-CM | POA: Diagnosis not present

## 2024-06-21 DIAGNOSIS — Z79899 Other long term (current) drug therapy: Secondary | ICD-10-CM | POA: Insufficient documentation

## 2024-06-21 DIAGNOSIS — R9431 Abnormal electrocardiogram [ECG] [EKG]: Secondary | ICD-10-CM | POA: Diagnosis not present

## 2024-06-24 NOTE — Progress Notes (Signed)
 Chief complaint: Diabetes  History of present illness: Monica Curtis is 64 y.o. female seen to follow up on her hyperprolactinemia, prediabetes and osteopenia. She was initially seeing Dr. Christi for these issues, but established care in our office in 01/2023 after his retirement. She was last seen on 11/02/2023. Since then, she was hospitalized from 11/25 - 11/27 x panic attack and QT prolongation (likely due to psych meds). Psych continued lamotrigine  and clonazepam  but held Invega , venlafaxine , wellbutrin , lexapro  and vilazodone . She did not require inpatient psych admission and follows closely with Dr. Katheren Sleet at [facility]. They recently restarted her Invega  and are monitoring  EKG routinely. She states she feels well today but admits she has not been taking her cabergoline as prescribed (stopped taking on her own ~6 months ago).  Hyperprolactinemia. She was previously evaluated for possible microadenoma but imaging was negative (see MRI from 2018 and 2021 below). She is back on paliperidone  monthly injections which is likely the cause for her long-standing high prolactin levels. She is also on clonazepam  and lamotrigine . She follows regularly with psychiatry.  She was on cabergoline 0.5 mg twice a week, but stopped this 6 months ago. She denies any issues with breast tenderness, nipple discharge, changes in vision or headaches. She states she would like to remain off this therapy if it is not needed (has a hard time opening the bottle and remembering to take this regularly).  Prediabetes. Her Hb A1c is now 5.6%, which is stable from prior. She is on no therapy at this time for her diabetes and does not check her sugars. Denies known family history of diabetes. She has no known complications of diabetes. She is taking and tolerating a statin. Blood pressure is good on no therapy. Urine Ma/Cr is normal.   Osteopenia. Last DEXA scan from 10/26/2023 was  notable for T-scores of -2.0 at L-spine, -0.6 at left femoral neck, and -0.2 at left total hip. There has not been any change in BMD since her last scan from 08/2021. Her 10-year risk for fracture at the hip or any major site are 0.3% and 6.8%, respectively. She is not on any therapy and denies any recent fractures. Her recent vitamin D  level was very low (11.4) and she is not on any supplementation.   Past Medical History:  Diagnosis Date   Allergy sulfa   Anxiety 2010   BPPV (benign paroxysmal positional vertigo)    Chronic constipation 20 years   Colon polyp 2020   Constipation    Depression    Diabetes mellitus type 2, uncomplicated (CMS/HHS-HCC)    Headache, unspecified    History of cataract 2002   Hyperlipidemia    Hyperprolactinemia (HHS-HCC)    Hypertension    Osteopenia    Schizoaffective disorder (CMS/HHS-HCC)    Substance abuse (CMS-HCC)    teenager   Tremor     Past Surgical History:  Procedure Laterality Date   HYSTERECTOMY  1993   CESAREAN SECTION  1993   COLONOSCOPY  01/16/2018   Tubular adenoma of the colon/Repeat 93yrs/MUS   POLYPECTOMY  2020   COLONOSCOPY  12/23/2020   internal hemorrhoids, normal colon. repeat 5 yrs/ TKT   CATARACT EXTRACTION  ?   COLONOSCOPY  2000?   no polyps per patient   UPPER GASTROINTESTINAL ENDOSCOPY      Family History  Problem Relation Name Age of Onset   Alzheimer's disease Mother Naomie Merles    Myocardial Infarction (Heart attack) Father  Ron Charma    Heart disease Father Shaelee Forni    Alcohol abuse Father Ron Hermida    Drug abuse Father Ron Clements    Migraines Sister Lori Hempfling    Breast cancer Sister Katheryn Hempfling    Depression Sister Katheryn Hempfling    High blood pressure (Hypertension) Maternal Grandfather Dempsey Nickels    Dementia Maternal Grandfather Dempsey Nickels    Breast cancer Paternal Grandmother     Diabetes Son     Dementia Maternal Aunt     Obesity Son  Donnice Monte     Outpatient Medications Marked as Taking for the 06/24/24 encounter (Office Visit) with Brendia Calton Squires, PA  Medication Sig Dispense Refill   amLODIPine  (NORVASC ) 5 MG tablet Take 5 mg by mouth once daily     atorvastatin  (LIPITOR) 20 MG tablet Take 20 mg by mouth once daily     clonazePAM  (KLONOPIN ) 0.25 MG disintegrating tablet Take 0.25 mg by mouth at bedtime     INVEGA  SUSTENNA 156 mg/mL IM syringe      lamoTRIgine  (LAMICTAL ) 200 MG tablet Take 2 tablets (400 mg total) by mouth once daily 30 tablet 1    Exam: BP (!) 152/88   Pulse 90   Ht 162.6 cm (5' 4)   Wt 73.9 kg (163 lb)   SpO2 98%   BMI 27.98 kg/m  GEN: well developed, well nourished, in NAD. HEENT: No proptosis. EOMI. No lid lag or stare.  NECK: supple, trachea midline, no gross thyromegaly. SKIN: no dermatopathy or rash.  NEURO: PERRL.  PSYC: alert and oriented, good insight. Flat affect   Labs Lab Results  Component Value Date   HGBA1C 5.6 05/14/2024   HGBA1C 5.5 10/26/2023   Appointment on 05/14/2024  Component Date Value Ref Range Status   Hemoglobin A1C 05/14/2024 5.6  4.2 - 5.6 % Final   Average Blood Glucose (Calc) 05/14/2024 114  mg/dL Final   Vitamin D , 25-Hydroxy - [facility] 05/14/2024 11.4 (L)  30.0 - 100.0 ng/mL Final   Vitamin D  deficiency has been defined by the Institute of Medicine and an Endocrine Society practice guideline as a level of serum 25-OH vitamin D  less than 20 ng/mL (1,2). The Endocrine Society went on to further define vitamin D  insufficiency as a level between 21 and 29 ng/mL (2). 1. IOM (Institute of Medicine). 2010. Dietary reference    intakes for calcium  and D. [place]: The    [employer]. 2. Holick MF, Binkley [place], Bischoff-Ferrari HA, et al.    Evaluation, treatment, and prevention of vitamin D     deficiency: an Endocrine Society clinical practice    guideline. JCEM. 2011 Jul; 96(7):1911-30.   Prolactin -  [facility] 05/14/2024 105.0 (H)  3.6 - 25.2 ng/mL Final   Creatinine, Random Urine 05/14/2024 151.6  37.0 - 250.0 mg/dL Final   Urine Albumin, Random 05/14/2024 15    mg/L Final   Urine Albumin/Creatinine Ratio 05/14/2024 9.9  <30.0 ug/mg Final   Urine:         Spot collection              (g/mg creatinine)     Normal               < 30   Moderately          30-299         increased   Clinical             >=300 albuminuria  Radiology MRI Head with and without contrast  (04/20/2020) CLINICAL DATA:  Vertigo over the last 3 years. Current symptoms over the last week.  EXAM: MRI HEAD WITHOUT AND WITH CONTRAST  TECHNIQUE: Multiplanar, multiecho pulse sequences of the brain and surrounding structures were obtained without and with intravenous contrast.  CONTRAST:  7mL GADAVIST  GADOBUTROL  1 MMOL/ML IV SOLN  COMPARISON:  None.  FINDINGS: Brain: The brain has a normal appearance without evidence of malformation, atrophy, old or acute small or large vessel infarction, mass lesion, hemorrhage, hydrocephalus or extra-axial collection. After contrast administration, no abnormal enhancement occurs. CP angle regions are normal.  Vascular: Major vessels at the base of the brain show flow. Venous sinuses appear patent.  Skull and upper cervical spine: Normal.  Sinuses/Orbits: There is some layering fluid in the left division of the sphenoid sinus and some opacified posterior ethmoid air cells on the left. Other sinuses are clear. Orbits appear normal.  Other: No fluid in the middle ears or mastoids.  IMPRESSION: 1. Normal appearance of the brain itself. No abnormality seen to explain the presenting symptoms. 2. Some layering fluid in the left division of the sphenoid sinus and some opacified posterior ethmoid air cells on the left.    MRI brain with and without contrast (11/16/2016) Valdemar Sanders, Eric Calamity, MD - 11/16/2016 Formatting of this note might be  different from the original. EXAM: Magnetic resonance imaging, brain without and with contrast material. DATE: 11/16/2016 12:15 PM ACCESSION: [id number] UN DICTATED: 11/16/2016 1:29 PM INTERPRETATION LOCATION: [facility]  CLINICAL INDICATION: 65 years old Female with OTHER-E22.1-Prolactin stimulation (CMS-HCC)    COMPARISON: MRI brain from 01/28/2015  TECHNIQUE: Multiplanar, multisequence MR imaging of the brain was performed without and with I.V. contrast. Imaging of the pituitary gland including dynamic and thin section coronal and sagittal images was also performed.  FINDINGS: No focal parenchymal signal abnormality is identified. There is no midline shift or mass lesion.  There is no evidence of intracranial hemorrhage or acute infarct.  No diffusion weighted signal abnormality is identified. Bone marrow signal is mildly diffusely heterogeneous in appearance, unchanged. Leftward nasal septum deviation. Sequelae of bilateral cataract surgery. Mild ethmoid sinus mucosal thickening. There is no enlargement of the pituitary gland or sella.  There is no intrasellar or suprasellar mass.  The optic chiasm is normal. Mild enhancement of the bilateral trigeminal nerves, most prominently in the cisternal segment, left greater than right noted, unchanged compared to prior MRI from 2016. Trace right mastoid fluid.  IMPRESSION: Indeterminate, mild enhancement of the bilateral trigeminal nerves, left greater than right, unchanged compared to prior MRI from 01/28/2015. Unremarkable MRI of the pituitary gland.   DEXA -- 10/26/2023 Narrative & Impression  [facility]- DEXA Report  HERON LARGO   REFERRING: [id number]                                                TECHNOLOGIST:    Randine LITTIE [place] HISTORY:  FOLLOW UP BONE DENSITY. 64 YEAR OLD WHITE FEMALE.     SITE DATE BMD g/cm   T-SCORE Z-SCORE g/cm CHANGE % CHANGE  STATISTICALLY               SIGNIFICANT?  LUMBAR SPINE L1- L4 08-19-21 0.833 -1.9               10-26-23 0.827 -2.0   -0.006 -  0.6% NO  HIP L.FEM. NECK 08-19-21 0.789 -0.5              10-26-23 0.783 -0.6   -0.006 -0.7% NO    L.TOTAL HIP 08-19-21 0.928 -0.1              10-26-23 0.912 -0.2   -0.016 -1.7% NO  FOREARM L/R 33%                                     INTERPRETATION:   Osteopenia/Low bone mass. No significant change in bone mineral density from 08-19-2021.     FRACTURE RISK ASSESSMENT (FRAX) __0.3___  10 year risk of hip fracture.      _6.8____  10 year risk of any major fracture.    TREATMENT:   The National Osteoporosis Foundation (2014) Treatment Guidelines for: Consider FDA-approved medical therapies in postmenopausal women and men aged 19 years and older, based on the following: A hip or vertebral (clinical or morphometric) fracture T-score <= -2.5 at the femoral neck or spine after appropriate evaluation to exclude secondary causes  Low bone mass (T-score between -1.0 and -2.5 at the femoral neck or spine) and a 10-year probability of a hip fracture >= 3% or a 10-year probability of a major osteoporosis-related fracture >= 20% based on the UA-adapted WHO algorithm Clinicians judgment and/or patient preferences may indicate treatment for people with 10-year fracture probabilities above or below these levels BMD should be monitored two years after initiating therapy and at two-year intervals thereafter.   _______________________________________ DEBBY BREAKER, M.D.           The [facility] DXA is a [employer] and technologist Le[employer] [facility]): Technologist                             Site LS Spine                                             Site Hip                                      Site Forearm TW                                              0.024 g/cm                                               0.030 g/cm                               0.021 g/cm at 95% confidence level          Assessment/Plan Hyperprolactinemia - Previous imaging studies were negative for pituitary mass. Hyperprolactinemia is most-likely secondary to anti-psychotic use, so will only need to treat to avoid symptoms of hyperprolactinemia. She has been without  therapy for 6 months and remains asymptomatic. Discussed that treatment is generally not necessary for asymptomatic hyperprolactinemia in a postmenopausal female taking paliperidone  with normal brain imaging. Will repeat MRI pituitary to assess for any changes or growth in pituitary gland, and if it remains normal, then will not need to resume therapy with cabergoline. Patient acknowledged understanding and agrees to plan.  Prediabetes - Prediabetes is improved and A1c is normal. Ok to remain off therapy at this time. If Hb A1c becomes elevated, will encourage she check a fasting sugar daily and bring meter to each follow up visit.  Continue low-carb diet and daily physical activity. - Lipids are much better controlled. Continue statin as prescribed.  - Urine Ma/Cr is normal.  Osteopenia/vitamin D  deficiency - Osteopenia as per recent DEXA, which is stable from 2023. Her fracture risk is low and she does not necessarily warrant treatment at this time. No recent fractures. Ok to continue monitoring for now. Encouraged healthy bone habits and avoidance of smoking and excessive alcohol intake. I did recommend she start weekly ergocalciferol  50,000 IU to correct vitamin D  deficiency. Rx sent. Will monitor this.    Follow up in 6 months, or sooner should any other issues arise, with labs done prior.   Attestation Statement:   I personally performed the service, non-incident to. (WP)   CASSANDRA BUEL COHN, PA
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
## 2024-06-25 ENCOUNTER — Ambulatory Visit

## 2024-07-01 ENCOUNTER — Encounter: Payer: Self-pay | Admitting: Psychiatry

## 2024-07-01 ENCOUNTER — Ambulatory Visit (INDEPENDENT_AMBULATORY_CARE_PROVIDER_SITE_OTHER): Admitting: Psychiatry

## 2024-07-01 VITALS — BP 136/78 | HR 96 | Temp 97.9°F | Ht 64.0 in | Wt 164.0 lb

## 2024-07-01 DIAGNOSIS — Z79899 Other long term (current) drug therapy: Secondary | ICD-10-CM | POA: Diagnosis not present

## 2024-07-01 DIAGNOSIS — F411 Generalized anxiety disorder: Secondary | ICD-10-CM | POA: Diagnosis not present

## 2024-07-01 DIAGNOSIS — F251 Schizoaffective disorder, depressive type: Secondary | ICD-10-CM

## 2024-07-01 DIAGNOSIS — G2401 Drug induced subacute dyskinesia: Secondary | ICD-10-CM

## 2024-07-01 MED ORDER — CLONAZEPAM 0.25 MG PO TBDP: ORAL_TABLET | ORAL | 1 refills | Status: AC

## 2024-07-01 MED ORDER — SERTRALINE HCL 25 MG PO TABS: 25.0000 mg | ORAL_TABLET | Freq: Every day | ORAL | 1 refills | Status: AC

## 2024-07-01 NOTE — Patient Instructions (Signed)
 Continue Invega  156 mg every 4 weeks  Start sertraline  25 mg at night  Increase lonazepam 0.25 mg ODT daily to twice a day for anxiety, 45 tabs per month Next appointment: 2/24 at 4:30

## 2024-07-03 ENCOUNTER — Ambulatory Visit

## 2024-07-04 ENCOUNTER — Ambulatory Visit

## 2024-07-04 ENCOUNTER — Ambulatory Visit (INDEPENDENT_AMBULATORY_CARE_PROVIDER_SITE_OTHER): Admitting: Licensed Clinical Social Worker

## 2024-07-04 ENCOUNTER — Other Ambulatory Visit: Payer: Self-pay

## 2024-07-04 VITALS — BP 148/78 | HR 85 | Temp 98.1°F | Ht 64.0 in | Wt 161.0 lb

## 2024-07-04 DIAGNOSIS — G2401 Drug induced subacute dyskinesia: Secondary | ICD-10-CM

## 2024-07-04 DIAGNOSIS — F411 Generalized anxiety disorder: Secondary | ICD-10-CM

## 2024-07-04 DIAGNOSIS — Z79899 Other long term (current) drug therapy: Secondary | ICD-10-CM | POA: Diagnosis not present

## 2024-07-04 DIAGNOSIS — F251 Schizoaffective disorder, depressive type: Secondary | ICD-10-CM
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
## 2024-07-04 NOTE — Progress Notes (Signed)
 Patient present flat affect mood was pleasant and denied visual or auditory hallucinations. No suicidal or homicidal ideations, no plan, intent, or means to want to harm self or others. Patients Invega  Sustenna 156 mg mg/ml IM injection prepared as ordered and administered to patient in her right deltoid. Patient tolerated without discomfort or pain. Patient will return in 28 days and will call if there is any changes.   NDC 49541-436-98 GTIN [id number] SN [id number] EXP 06-12-2025 LOT [id number]

## 2024-07-04 NOTE — Patient Instructions (Signed)
 Patient present flat affect mood was pleasant and denied visual or auditory hallucinations. No suicidal or homicidal ideations, no plan, intent, or means to want to harm self or others. Patients Invega  Sustenna 156 mg mg/ml IM injection prepared as ordered and administered to patient in her right deltoid. Patient tolerated without discomfort or pain. Patient will return in 28 days and will call if there is any changes.   NDC 49541-436-98 GTIN [id number] SN [id number] EXP 06-12-2025 LOT [id number]

## 2024-07-05 ENCOUNTER — Other Ambulatory Visit: Payer: Self-pay | Admitting: Psychiatry

## 2024-07-16 ENCOUNTER — Telehealth: Payer: Self-pay

## 2024-07-16 NOTE — Telephone Encounter (Signed)
 Patient called to request a refill of clonazePAM  (KLONOPIN ) 0.25 MG disintegrating tablet    Patient stated that the provider would give her more of the medication so that she does not run out and she also mentioned that she has had to take more than per day   Last visit 07-01-24 Next visit 2.24.26       Preferred Pharmacies   [facility] [address] [facility] - [place], [place] - [address] AT [address] Phone: [phone]  Fax: [phone]

## 2024-07-17 ENCOUNTER — Telehealth: Payer: Self-pay

## 2024-07-17 NOTE — Telephone Encounter (Signed)
 Pharmacy fax requesting clarification for medication  clonazePAM  clonazePAM  (KLONOPIN ) 0.25 MG disintegrating tablet Asking if it needs to be tritiated down or if instructions are correct  Multiple Dosages: Starting Mon 07/01/2024, Until Fri 08/30/2024 at 2359 May take 1 tablet (0.25 mg total) by mouth at bedtime as needed (anxiety). May also take 1 tablet (0.25 mg total) every other day as needed (anxiety)., Normal   Pharmacy:[facility] [address] - [place], [place] - [address]

## 2024-07-17 NOTE — Telephone Encounter (Signed)
 Pharmacist confirmed and verbalized understanding from instructions below.

## 2024-07-17 NOTE — Telephone Encounter (Signed)
 Instructions are correct- she can take 0.25 mg daily as needed, and 0.25 mg every other day (additional) as needed. Total of 45 tabs per month to allow this.
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
## 2024-07-18 ENCOUNTER — Ambulatory Visit (INDEPENDENT_AMBULATORY_CARE_PROVIDER_SITE_OTHER): Admitting: Licensed Clinical Social Worker

## 2024-07-18 ENCOUNTER — Encounter: Payer: Self-pay | Admitting: Licensed Clinical Social Worker

## 2024-07-18 DIAGNOSIS — F251 Schizoaffective disorder, depressive type: Secondary | ICD-10-CM | POA: Diagnosis not present

## 2024-07-18 DIAGNOSIS — Z79899 Other long term (current) drug therapy: Secondary | ICD-10-CM | POA: Diagnosis not present

## 2024-07-18 DIAGNOSIS — G2401 Drug induced subacute dyskinesia: Secondary | ICD-10-CM | POA: Diagnosis not present

## 2024-07-18 DIAGNOSIS — F411 Generalized anxiety disorder: Secondary | ICD-10-CM

## 2024-08-01 ENCOUNTER — Ambulatory Visit

## 2024-08-06 ENCOUNTER — Ambulatory Visit: Admitting: Psychiatry

## 2024-08-15 ENCOUNTER — Ambulatory Visit: Admitting: Licensed Clinical Social Worker

## 2024-08-29 ENCOUNTER — Ambulatory Visit: Admitting: Licensed Clinical Social Worker

## 2024-09-24 ENCOUNTER — Ambulatory Visit: Admitting: Internal Medicine
